# Patient Record
Sex: Male | Born: 1935 | Race: White | Hispanic: No | Smoking: Former smoker
Health system: Southern US, Community
[De-identification: ages and names within clinical notes are randomized; demographics above are authoritative.]

## PROBLEM LIST (undated history)

## (undated) DIAGNOSIS — R251 Tremor, unspecified: Secondary | ICD-10-CM

## (undated) DIAGNOSIS — E039 Hypothyroidism, unspecified: Secondary | ICD-10-CM

## (undated) DIAGNOSIS — I4891 Unspecified atrial fibrillation: Secondary | ICD-10-CM

## (undated) DIAGNOSIS — E785 Hyperlipidemia, unspecified: Secondary | ICD-10-CM

## (undated) DIAGNOSIS — C189 Malignant neoplasm of colon, unspecified: Secondary | ICD-10-CM

## (undated) DIAGNOSIS — F0390 Unspecified dementia without behavioral disturbance: Secondary | ICD-10-CM

## (undated) DIAGNOSIS — R159 Full incontinence of feces: Secondary | ICD-10-CM

## (undated) DIAGNOSIS — I672 Cerebral atherosclerosis: Secondary | ICD-10-CM

## (undated) DIAGNOSIS — G459 Transient cerebral ischemic attack, unspecified: Secondary | ICD-10-CM

## (undated) DIAGNOSIS — F329 Major depressive disorder, single episode, unspecified: Secondary | ICD-10-CM

## (undated) DIAGNOSIS — I739 Peripheral vascular disease, unspecified: Secondary | ICD-10-CM

## (undated) DIAGNOSIS — I2510 Atherosclerotic heart disease of native coronary artery without angina pectoris: Secondary | ICD-10-CM

## (undated) DIAGNOSIS — M25559 Pain in unspecified hip: Secondary | ICD-10-CM

## (undated) DIAGNOSIS — D518 Other vitamin B12 deficiency anemias: Secondary | ICD-10-CM

## (undated) DIAGNOSIS — K862 Cyst of pancreas: Secondary | ICD-10-CM

## (undated) DIAGNOSIS — M6281 Muscle weakness (generalized): Secondary | ICD-10-CM

## (undated) DIAGNOSIS — N429 Disorder of prostate, unspecified: Secondary | ICD-10-CM

## (undated) DIAGNOSIS — I509 Heart failure, unspecified: Secondary | ICD-10-CM

## (undated) DIAGNOSIS — K8020 Calculus of gallbladder without cholecystitis without obstruction: Secondary | ICD-10-CM

## (undated) DIAGNOSIS — L988 Other specified disorders of the skin and subcutaneous tissue: Secondary | ICD-10-CM

## (undated) DIAGNOSIS — E119 Type 2 diabetes mellitus without complications: Secondary | ICD-10-CM

## (undated) DIAGNOSIS — N419 Inflammatory disease of prostate, unspecified: Secondary | ICD-10-CM

## (undated) DIAGNOSIS — I219 Acute myocardial infarction, unspecified: Secondary | ICD-10-CM

## (undated) DIAGNOSIS — J449 Chronic obstructive pulmonary disease, unspecified: Secondary | ICD-10-CM

## (undated) DIAGNOSIS — F32A Depression, unspecified: Secondary | ICD-10-CM

## (undated) DIAGNOSIS — N10 Acute tubulo-interstitial nephritis: Secondary | ICD-10-CM

## (undated) DIAGNOSIS — R0989 Other specified symptoms and signs involving the circulatory and respiratory systems: Secondary | ICD-10-CM

## (undated) DIAGNOSIS — B029 Zoster without complications: Secondary | ICD-10-CM

## (undated) DIAGNOSIS — I779 Disorder of arteries and arterioles, unspecified: Secondary | ICD-10-CM

## (undated) DIAGNOSIS — I10 Essential (primary) hypertension: Secondary | ICD-10-CM

## (undated) DIAGNOSIS — R339 Retention of urine, unspecified: Secondary | ICD-10-CM

## (undated) DIAGNOSIS — F0150 Vascular dementia without behavioral disturbance: Secondary | ICD-10-CM

## (undated) DIAGNOSIS — K8689 Other specified diseases of pancreas: Secondary | ICD-10-CM

## (undated) HISTORY — DX: Chronic obstructive pulmonary disease, unspecified: J44.9

## (undated) HISTORY — DX: Vascular dementia, unspecified severity, without behavioral disturbance, psychotic disturbance, mood disturbance, and anxiety: F01.50

## (undated) HISTORY — DX: Depression, unspecified: F32.A

## (undated) HISTORY — DX: Type 2 diabetes mellitus without complications: E11.9

## (undated) HISTORY — DX: Peripheral vascular disease, unspecified: I73.9

## (undated) HISTORY — DX: Essential (primary) hypertension: I10

## (undated) HISTORY — DX: Disorder of prostate, unspecified: N42.9

## (undated) HISTORY — DX: Retention of urine, unspecified: R33.9

## (undated) HISTORY — DX: Other specified symptoms and signs involving the circulatory and respiratory systems: R09.89

## (undated) HISTORY — DX: Unspecified atrial fibrillation: I48.91

## (undated) HISTORY — DX: Muscle weakness (generalized): M62.81

## (undated) HISTORY — DX: Malignant neoplasm of colon, unspecified: C18.9

## (undated) HISTORY — DX: Acute myocardial infarction, unspecified: I21.9

## (undated) HISTORY — DX: Cyst of pancreas: K86.2

## (undated) HISTORY — DX: Other specified diseases of pancreas: K86.89

## (undated) HISTORY — DX: Zoster without complications: B02.9

## (undated) HISTORY — DX: Full incontinence of feces: R15.9

## (undated) HISTORY — DX: Hyperlipidemia, unspecified: E78.5

## (undated) HISTORY — DX: Unspecified dementia, unspecified severity, without behavioral disturbance, psychotic disturbance, mood disturbance, and anxiety: F03.90

## (undated) HISTORY — DX: Atherosclerotic heart disease of native coronary artery without angina pectoris: I25.10

## (undated) HISTORY — DX: Cerebral atherosclerosis: I67.2

## (undated) HISTORY — DX: Pain in unspecified hip: M25.559

## (undated) HISTORY — DX: Hypothyroidism, unspecified: E03.9

## (undated) HISTORY — DX: Heart failure, unspecified: I50.9

## (undated) HISTORY — DX: Other vitamin B12 deficiency anemias: D51.8

## (undated) HISTORY — DX: Other specified disorders of the skin and subcutaneous tissue: L98.8

## (undated) HISTORY — DX: Disorder of arteries and arterioles, unspecified: I77.9

## (undated) HISTORY — DX: Major depressive disorder, single episode, unspecified: F32.9

## (undated) HISTORY — DX: Acute pyelonephritis: N10

---

## 1987-07-02 HISTORY — PX: COLON SURGERY: SHX602

## 1988-07-01 HISTORY — PX: CHOLECYSTECTOMY: SHX55

## 2001-07-01 HISTORY — PX: HERNIA REPAIR: SHX51

## 2002-01-26 ENCOUNTER — Encounter: Payer: Self-pay | Admitting: Emergency Medicine

## 2002-01-26 ENCOUNTER — Emergency Department (HOSPITAL_COMMUNITY): Admission: EM | Admit: 2002-01-26 | Discharge: 2002-01-27 | Payer: Self-pay | Admitting: Emergency Medicine

## 2002-02-03 ENCOUNTER — Encounter: Payer: Self-pay | Admitting: Emergency Medicine

## 2002-02-04 ENCOUNTER — Inpatient Hospital Stay (HOSPITAL_COMMUNITY): Admission: EM | Admit: 2002-02-04 | Discharge: 2002-02-05 | Payer: Self-pay | Admitting: *Deleted

## 2002-02-17 ENCOUNTER — Encounter: Payer: Self-pay | Admitting: General Surgery

## 2002-02-17 ENCOUNTER — Inpatient Hospital Stay (HOSPITAL_COMMUNITY): Admission: EM | Admit: 2002-02-17 | Discharge: 2002-02-25 | Payer: Self-pay | Admitting: Emergency Medicine

## 2003-04-21 ENCOUNTER — Ambulatory Visit (HOSPITAL_COMMUNITY): Admission: RE | Admit: 2003-04-21 | Discharge: 2003-04-21 | Payer: Self-pay | Admitting: General Surgery

## 2003-04-21 ENCOUNTER — Encounter: Payer: Self-pay | Admitting: General Surgery

## 2003-04-25 ENCOUNTER — Inpatient Hospital Stay (HOSPITAL_COMMUNITY): Admission: RE | Admit: 2003-04-25 | Discharge: 2003-05-06 | Payer: Self-pay | Admitting: General Surgery

## 2003-04-25 ENCOUNTER — Encounter: Payer: Self-pay | Admitting: General Surgery

## 2003-04-25 ENCOUNTER — Encounter (INDEPENDENT_AMBULATORY_CARE_PROVIDER_SITE_OTHER): Payer: Self-pay

## 2004-07-01 HISTORY — PX: CORONARY ARTERY BYPASS GRAFT: SHX141

## 2004-07-01 HISTORY — PX: CATARACT EXTRACTION: SUR2

## 2004-11-30 ENCOUNTER — Inpatient Hospital Stay (HOSPITAL_COMMUNITY): Admission: EM | Admit: 2004-11-30 | Discharge: 2004-12-11 | Payer: Self-pay | Admitting: Cardiovascular Disease

## 2004-11-30 ENCOUNTER — Encounter: Payer: Self-pay | Admitting: Emergency Medicine

## 2004-12-03 HISTORY — PX: CARDIAC CATHETERIZATION: SHX172

## 2004-12-04 ENCOUNTER — Ambulatory Visit: Payer: Self-pay | Admitting: Pulmonary Disease

## 2004-12-05 ENCOUNTER — Encounter: Payer: Self-pay | Admitting: Pulmonary Disease

## 2004-12-28 ENCOUNTER — Ambulatory Visit: Payer: Self-pay | Admitting: Pulmonary Disease

## 2005-01-08 ENCOUNTER — Inpatient Hospital Stay (HOSPITAL_COMMUNITY): Admission: RE | Admit: 2005-01-08 | Discharge: 2005-01-12 | Payer: Self-pay | Admitting: Cardiothoracic Surgery

## 2005-01-27 ENCOUNTER — Emergency Department (HOSPITAL_COMMUNITY): Admission: EM | Admit: 2005-01-27 | Discharge: 2005-01-27 | Payer: Self-pay | Admitting: Emergency Medicine

## 2005-02-14 ENCOUNTER — Encounter: Admission: RE | Admit: 2005-02-14 | Discharge: 2005-02-14 | Payer: Self-pay | Admitting: Cardiothoracic Surgery

## 2006-10-21 DIAGNOSIS — R0989 Other specified symptoms and signs involving the circulatory and respiratory systems: Secondary | ICD-10-CM

## 2006-10-21 HISTORY — DX: Other specified symptoms and signs involving the circulatory and respiratory systems: R09.89

## 2008-01-27 HISTORY — PX: COLONOSCOPY: SHX174

## 2008-04-12 ENCOUNTER — Ambulatory Visit (HOSPITAL_COMMUNITY): Admission: RE | Admit: 2008-04-12 | Discharge: 2008-04-12 | Payer: Self-pay | Admitting: Gastroenterology

## 2009-03-02 HISTORY — PX: COLONOSCOPY: SHX174

## 2009-03-08 LAB — HM COLONOSCOPY

## 2009-09-10 ENCOUNTER — Inpatient Hospital Stay (HOSPITAL_COMMUNITY): Admission: EM | Admit: 2009-09-10 | Discharge: 2009-09-14 | Payer: Self-pay | Admitting: Emergency Medicine

## 2009-11-10 DIAGNOSIS — I672 Cerebral atherosclerosis: Secondary | ICD-10-CM

## 2009-11-10 HISTORY — DX: Cerebral atherosclerosis: I67.2

## 2010-04-15 ENCOUNTER — Encounter: Payer: Self-pay | Admitting: Emergency Medicine

## 2010-04-15 ENCOUNTER — Ambulatory Visit: Payer: Self-pay | Admitting: Diagnostic Radiology

## 2010-04-15 ENCOUNTER — Inpatient Hospital Stay (HOSPITAL_COMMUNITY): Admission: EM | Admit: 2010-04-15 | Discharge: 2010-04-23 | Payer: Self-pay | Admitting: Internal Medicine

## 2010-04-16 ENCOUNTER — Encounter (INDEPENDENT_AMBULATORY_CARE_PROVIDER_SITE_OTHER): Payer: Self-pay | Admitting: Internal Medicine

## 2010-04-16 HISTORY — PX: TEE WITH CARDIOVERSION: SHX5442

## 2010-04-23 ENCOUNTER — Encounter: Payer: Self-pay | Admitting: Internal Medicine

## 2010-05-12 ENCOUNTER — Emergency Department (HOSPITAL_COMMUNITY): Admission: EM | Admit: 2010-05-12 | Discharge: 2010-05-12 | Payer: Self-pay | Admitting: Emergency Medicine

## 2010-06-12 DIAGNOSIS — I482 Chronic atrial fibrillation, unspecified: Secondary | ICD-10-CM

## 2010-06-12 DIAGNOSIS — I10 Essential (primary) hypertension: Secondary | ICD-10-CM | POA: Insufficient documentation

## 2010-06-12 DIAGNOSIS — I6529 Occlusion and stenosis of unspecified carotid artery: Secondary | ICD-10-CM

## 2010-06-12 DIAGNOSIS — I5032 Chronic diastolic (congestive) heart failure: Secondary | ICD-10-CM | POA: Insufficient documentation

## 2010-06-12 DIAGNOSIS — E039 Hypothyroidism, unspecified: Secondary | ICD-10-CM

## 2010-06-12 DIAGNOSIS — C189 Malignant neoplasm of colon, unspecified: Secondary | ICD-10-CM | POA: Insufficient documentation

## 2010-06-12 DIAGNOSIS — J439 Emphysema, unspecified: Secondary | ICD-10-CM

## 2010-06-13 ENCOUNTER — Ambulatory Visit: Payer: Self-pay | Admitting: Pulmonary Disease

## 2010-06-13 DIAGNOSIS — I219 Acute myocardial infarction, unspecified: Secondary | ICD-10-CM | POA: Insufficient documentation

## 2010-07-31 NOTE — Letter (Signed)
Summary: [facility]   [place]   Imported By: Roderic Ovens 06/04/2010 09:30:47  _____________________________________________________________________  External Attachment:    Type:   Image     Comment:   External Document

## 2010-08-02 NOTE — Progress Notes (Signed)
Summary: [facility]   Imported By: Sherian Rein 06/15/2010 07:16:18  _____________________________________________________________________  External Attachment:    Type:   Image     Comment:   External Document

## 2010-08-02 NOTE — Assessment & Plan Note (Signed)
Summary: consult for copd   Primary Provider/Referring Provider:  Baltazar Najjar MD  CC:  pt last seen 11/2004. pt c/o SOB and pulm edema.  History of Present Illness: the pt is an 77y/o male who I have been asked to see for management of COPD.  He is known to me from the past, but has not been seen since 2006.  He had pfts at that time which showed moderate airflow obstruction with significant airtrapping.  He was treated with spiriva, but the pt never followed up with me.  He comes in today after a recent hospitalization for afib, decompensated diastolic heart failure, and ?copd exacerbation.  The pt has chronic doe, but is doing much better since being diuresed.  He currently is not on a maintenance bronchodilator.  He does have a dry cough with frequent clearing due to a tickle.  He is on an ACE inhibitor.  He denies chest congestion or purulence.  Current Medications (verified): 1)  Metformin Hcl 1000 Mg Tabs (Metformin Hcl) .Marland Kitchen.. 1 Two Times A Day 2)  Docusate Sodium 100 Mg Tabs (Docusate Sodium) .Marland Kitchen.. 1 Two Times A Day 3)  Vitamin B-12 1000 Mcg Tabs (Cyanocobalamin) .Marland Kitchen.. 1 Two Times A Day 4)  Mucinex 600 Mg Xr12h-Tab (Guaifenesin) .Marland Kitchen.. 1 Two Times A Day 5)  Flomax 0.4 Mg Caps (Tamsulosin Hcl) .... Once Daily 6)  Cardizem Cd 240 Mg Xr24h-Cap (Diltiazem Hcl Coated Beads) .... Once Daily 7)  Aspirin 325 Mg Tabs (Aspirin) .... Once Daily 8)  Niacin Cr 1000 Mg Cr-Tabs (Niacin) .... Once Daily 9)  Lisinopril 5 Mg Tabs (Lisinopril) .... Once Daily 10)  Aricept 10 Mg Tabs (Donepezil Hcl) .Marland Kitchen.. 1 At Bedtime 11)  Simvastatin 40 Mg Tabs (Simvastatin) .... At Bedtime 12)  Remeron 30 Mg Tabs (Mirtazapine) .... Once Daily At Bedtime 13)  Levothyroxine Sodium 25 Mcg Tabs (Levothyroxine Sodium) .... Once Daily 14)  Metoprolol Succinate 25 Mg Xr24h-Tab (Metoprolol Succinate) .Marland Kitchen.. 1 Two Times A Day 15)  Nitrostat 0.4 Mg Subl (Nitroglycerin) .... As Needed  Allergies (verified): 1)  ! * Peanuts  Past  History:  Past Medical History:  CHF (ICD-428.0)--diastolic dysfunction ATRIAL FIBRILLATION (ICD-427.31) COLON CANCER (ICD-153.9) CAROTID ARTERY DISEASE (ICD-433.10) COPD (ICD-496)-moderate 2006 by pfts.  FEV1 1.39 HYPERLIPIDEMIA (ICD-272.4) DIABETES MELLITUS, TYPE II (ICD-250.00) HYPERTENSION (ICD-401.9) HYPOTHYROIDISM (ICD-244.9)  hx of pancreatic cyst Myocardial Infarction  Past Surgical History: hernia repair Cholecystectomy heart bypass colon surgery left cataract surgery  Family History: Reviewed history and no changes required. heart disease: mother --MI 1 sister: breast cancer Alzheimers: sister and half brother  Social History: Reviewed history from 06/12/2010 and no changes required. Patient states former smoker. 1970's. 2 ppd. started age 21. retired Psychologist, sport and exercise single occas beer  Review of Systems       The patient complains of shortness of breath with activity, shortness of breath at rest, productive cough, non-productive cough, chest pain, irregular heartbeats, difficulty swallowing, and depression.  The patient denies coughing up blood, acid heartburn, indigestion, loss of appetite, weight change, abdominal pain, sore throat, tooth/dental problems, headaches, nasal congestion/difficulty breathing through nose, sneezing, itching, ear ache, anxiety, hand/feet swelling, joint stiffness or pain, rash, change in color of mucus, and fever.    Vital Signs:  Patient profile:   77 year old male Height:      69.5 inches Weight:      205 pounds BMI:     29.95 O2 Sat:      97 % on Room air  Temp:     98.0 degrees F oral Pulse rate:   104 / minute BP sitting:   150 / 78  (left arm) Cuff size:   regular  Vitals Entered By: Carver Fila (June 13, 2010 1:45 PM)  O2 Flow:  Room air CC: pt last seen 11/2004. pt c/o SOB, pulm edema Comments meds and allergies updated Phone number updated Carver Fila  June 13, 2010 1:45 PM    Physical  Exam  General:  overweight male in nad Eyes:  PERRLA and EOMI.   Nose:  mild septal deviation, but patent Mouth:  long uvula, normal palate no lesions seen Neck:  no jvd, tmg, LN Lungs:  minimal basilar crackles, no wheezing or rhonchi Heart:  irreg rhythm with cvr Abdomen:  soft and nontender, bs+ Extremities:  mild pedal edema, no cyanosis pulses intact distally, but decreased. Neurologic:  alert and oriented, moves all 4.   Impression & Recommendations:  Problem # 1:  COPD (ICD-496) the pt has a h/o moderate copd dating back to 2006.  He currently is having issues with diastolic heart failure, and it is unclear if his  obstructive disease is playing a role here.  He is clearly deconditioned as well.  Will give him a trial of bronchodilators to see if he sees a difference.  If not, would just leave him on as needed albuterol or xopenex alone.  I have also stressed to him the importance of an exercise and conditioning program.  Medications Added to Medication List This Visit: 1)  Metformin Hcl 1000 Mg Tabs (Metformin hcl) .Marland Kitchen.. 1 two times a day 2)  Docusate Sodium 100 Mg Tabs (Docusate sodium) .Marland Kitchen.. 1 two times a day 3)  Vitamin B-12 1000 Mcg Tabs (Cyanocobalamin) .Marland Kitchen.. 1 two times a day 4)  Mucinex 600 Mg Xr12h-tab (Guaifenesin) .Marland Kitchen.. 1 two times a day 5)  Flomax 0.4 Mg Caps (Tamsulosin hcl) .... Once daily 6)  Cardizem Cd 240 Mg Xr24h-cap (Diltiazem hcl coated beads) .... Once daily 7)  Aspirin 325 Mg Tabs (Aspirin) .... Once daily 8)  Niacin Cr 1000 Mg Cr-tabs (Niacin) .... Once daily 9)  Lisinopril 5 Mg Tabs (Lisinopril) .... Once daily 10)  Aricept 10 Mg Tabs (Donepezil hcl) .Marland Kitchen.. 1 at bedtime 11)  Simvastatin 40 Mg Tabs (Simvastatin) .... At bedtime 12)  Remeron 30 Mg Tabs (Mirtazapine) .... Once daily at bedtime 13)  Levothyroxine Sodium 25 Mcg Tabs (Levothyroxine sodium) .... Once daily 14)  Metoprolol Succinate 25 Mg Xr24h-tab (Metoprolol succinate) .Marland Kitchen.. 1 two times a  day 15)  Nitrostat 0.4 Mg Subl (Nitroglycerin) .... As needed  Other Orders: Consultation Level IV (44010)  Patient Instructions: 1)  will try you on spiriva one inhalation each am 2)  proair inhaler 2puffs up to every 6 hrs if needed for emergencies. 3)  if you feel spiriva is helping, please let us know and will arrange for prescription to the pharmacy.  If no difference noted, discontinue spiriva and use proair as needed. 4)  followup with me in 4mos   Immunization History:  Influenza Immunization History:    Influenza:  historical (04/17/2010)  Pneumovax Immunization History:    Pneumovax:  historical (04/17/2010)

## 2010-09-11 LAB — DIFFERENTIAL
Basophils Absolute: 0 10*3/uL (ref 0.0–0.1)
Eosinophils Absolute: 0.3 10*3/uL (ref 0.0–0.7)
Eosinophils Relative: 4 % (ref 0–5)
Lymphocytes Relative: 39 % (ref 12–46)
Lymphs Abs: 2.5 10*3/uL (ref 0.7–4.0)
Neutrophils Relative %: 46 % (ref 43–77)

## 2010-09-11 LAB — CBC
HCT: 40.3 % (ref 39.0–52.0)
Hemoglobin: 13.7 g/dL (ref 13.0–17.0)
MCH: 30.9 pg (ref 26.0–34.0)
MCHC: 34 g/dL (ref 30.0–36.0)

## 2010-09-11 LAB — LACTIC ACID, PLASMA: Lactic Acid, Venous: 1.4 mmol/L (ref 0.5–2.2)

## 2010-09-11 LAB — HEPATIC FUNCTION PANEL
Albumin: 3.1 g/dL — ABNORMAL LOW (ref 3.5–5.2)
Alkaline Phosphatase: 113 U/L (ref 39–117)
Indirect Bilirubin: 0.2 mg/dL — ABNORMAL LOW (ref 0.3–0.9)
Total Protein: 6.8 g/dL (ref 6.0–8.3)

## 2010-09-11 LAB — BASIC METABOLIC PANEL
CO2: 28 mEq/L (ref 19–32)
Glucose, Bld: 97 mg/dL (ref 70–99)
Potassium: 3.2 mEq/L — ABNORMAL LOW (ref 3.5–5.1)
Sodium: 137 mEq/L (ref 135–145)

## 2010-09-12 LAB — CBC
HCT: 39 % (ref 39.0–52.0)
HCT: 40.4 % (ref 39.0–52.0)
HCT: 40.8 % (ref 39.0–52.0)
HCT: 45.5 % (ref 39.0–52.0)
HCT: 45.5 % (ref 39.0–52.0)
HCT: 44.2 % (ref 39.0–52.0)
Hemoglobin: 13 g/dL (ref 13.0–17.0)
Hemoglobin: 13.5 g/dL (ref 13.0–17.0)
Hemoglobin: 13.9 g/dL (ref 13.0–17.0)
Hemoglobin: 15 g/dL (ref 13.0–17.0)
MCH: 30.1 pg (ref 26.0–34.0)
MCH: 30.6 pg (ref 26.0–34.0)
MCH: 30.8 pg (ref 26.0–34.0)
MCH: 31 pg (ref 26.0–34.0)
MCHC: 33.4 g/dL (ref 30.0–36.0)
MCHC: 33.6 g/dL (ref 30.0–36.0)
MCHC: 34.1 g/dL (ref 30.0–36.0)
MCV: 89.6 fL (ref 78.0–100.0)
MCV: 89.9 fL (ref 78.0–100.0)
MCV: 90.7 fL (ref 78.0–100.0)
Platelets: 285 10*3/uL (ref 150–400)
Platelets: 326 10*3/uL (ref 150–400)
Platelets: 354 10*3/uL (ref 150–400)
Platelets: 400 10*3/uL (ref 150–400)
Platelets: 269 10*3/uL (ref 150–400)
RBC: 4.3 MIL/uL (ref 4.22–5.81)
RBC: 4.41 MIL/uL (ref 4.22–5.81)
RBC: 4.87 MIL/uL (ref 4.22–5.81)
RBC: 5.06 MIL/uL (ref 4.22–5.81)
RBC: 4.63 MIL/uL (ref 4.22–5.81)
RDW: 14 % (ref 11.5–15.5)
RDW: 14.1 % (ref 11.5–15.5)
RDW: 14.4 % (ref 11.5–15.5)
RDW: 14.4 % (ref 11.5–15.5)
RDW: 14.4 % (ref 11.5–15.5)
RDW: 14.7 % (ref 11.5–15.5)
RDW: 14 % (ref 11.5–15.5)
WBC: 14.8 10*3/uL — ABNORMAL HIGH (ref 4.0–10.5)
WBC: 15.3 10*3/uL — ABNORMAL HIGH (ref 4.0–10.5)
WBC: 16.4 10*3/uL — ABNORMAL HIGH (ref 4.0–10.5)
WBC: 18.8 10*3/uL — ABNORMAL HIGH (ref 4.0–10.5)
WBC: 20.3 10*3/uL — ABNORMAL HIGH (ref 4.0–10.5)
WBC: 6.5 10*3/uL (ref 4.0–10.5)
WBC: 13 10*3/uL — ABNORMAL HIGH (ref 4.0–10.5)

## 2010-09-12 LAB — GLUCOSE, CAPILLARY
Glucose-Capillary: 187 mg/dL — ABNORMAL HIGH (ref 70–99)
Glucose-Capillary: 190 mg/dL — ABNORMAL HIGH (ref 70–99)
Glucose-Capillary: 210 mg/dL — ABNORMAL HIGH (ref 70–99)
Glucose-Capillary: 228 mg/dL — ABNORMAL HIGH (ref 70–99)
Glucose-Capillary: 228 mg/dL — ABNORMAL HIGH (ref 70–99)
Glucose-Capillary: 251 mg/dL — ABNORMAL HIGH (ref 70–99)
Glucose-Capillary: 256 mg/dL — ABNORMAL HIGH (ref 70–99)
Glucose-Capillary: 269 mg/dL — ABNORMAL HIGH (ref 70–99)
Glucose-Capillary: 295 mg/dL — ABNORMAL HIGH (ref 70–99)
Glucose-Capillary: 303 mg/dL — ABNORMAL HIGH (ref 70–99)
Glucose-Capillary: 314 mg/dL — ABNORMAL HIGH (ref 70–99)
Glucose-Capillary: 358 mg/dL — ABNORMAL HIGH (ref 70–99)
Glucose-Capillary: 369 mg/dL — ABNORMAL HIGH (ref 70–99)
Glucose-Capillary: 369 mg/dL — ABNORMAL HIGH (ref 70–99)
Glucose-Capillary: 416 mg/dL — ABNORMAL HIGH (ref 70–99)
Glucose-Capillary: 97 mg/dL (ref 70–99)

## 2010-09-12 LAB — COMPREHENSIVE METABOLIC PANEL
ALT: 53 U/L (ref 0–53)
AST: 46 U/L — ABNORMAL HIGH (ref 0–37)
Alkaline Phosphatase: 85 U/L (ref 39–117)
CO2: 26 mEq/L (ref 19–32)
Calcium: 9.3 mg/dL (ref 8.4–10.5)
Chloride: 104 mEq/L (ref 96–112)
GFR calc Af Amer: >60 mL/min (ref >60)
GFR calc non Af Amer: >60 mL/min (ref >60)
Glucose, Bld: 227 mg/dL — ABNORMAL HIGH (ref 70–99)
Potassium: 3.7 mEq/L (ref 3.5–5.1)
Sodium: 140 mEq/L (ref 135–145)

## 2010-09-12 LAB — BASIC METABOLIC PANEL
BUN: 25 mg/dL — ABNORMAL HIGH (ref 6–23)
BUN: 33 mg/dL — ABNORMAL HIGH (ref 6–23)
BUN: 34 mg/dL — ABNORMAL HIGH (ref 6–23)
BUN: 40 mg/dL — ABNORMAL HIGH (ref 6–23)
BUN: 43 mg/dL — ABNORMAL HIGH (ref 6–23)
BUN: 14 mg/dL (ref 6–23)
CO2: 26 mEq/L (ref 19–32)
CO2: 26 mEq/L (ref 19–32)
CO2: 26 mEq/L (ref 19–32)
CO2: 27 mEq/L (ref 19–32)
Calcium: 8.7 mg/dL (ref 8.4–10.5)
Calcium: 8.9 mg/dL (ref 8.4–10.5)
Calcium: 9 mg/dL (ref 8.4–10.5)
Calcium: 9.1 mg/dL (ref 8.4–10.5)
Chloride: 100 mEq/L (ref 96–112)
Chloride: 103 mEq/L (ref 96–112)
Chloride: 105 mEq/L (ref 96–112)
Chloride: 106 mEq/L (ref 96–112)
Chloride: 103 mEq/L (ref 96–112)
Creatinine, Ser: 0.97 mg/dL (ref 0.4–1.5)
Creatinine, Ser: 1.04 mg/dL (ref 0.4–1.5)
Creatinine, Ser: 1.21 mg/dL (ref 0.4–1.5)
Creatinine, Ser: 1.21 mg/dL (ref 0.4–1.5)
Creatinine, Ser: 0.9 mg/dL (ref 0.4–1.5)
GFR calc Af Amer: >60 mL/min (ref >60)
GFR calc Af Amer: >60 mL/min (ref >60)
GFR calc Af Amer: >60 mL/min (ref >60)
GFR calc non Af Amer: 58 mL/min — ABNORMAL LOW (ref >60)
GFR calc non Af Amer: >60 mL/min (ref >60)
GFR calc non Af Amer: >60 mL/min (ref >60)
GFR calc non Af Amer: >60 mL/min (ref >60)
GFR calc non Af Amer: >60 mL/min (ref >60)
Glucose, Bld: 165 mg/dL — ABNORMAL HIGH (ref 70–99)
Glucose, Bld: 231 mg/dL — ABNORMAL HIGH (ref 70–99)
Potassium: 2.8 mEq/L — ABNORMAL LOW (ref 3.5–5.1)
Potassium: 3.5 mEq/L (ref 3.5–5.1)
Potassium: 3.6 mEq/L (ref 3.5–5.1)
Potassium: 4.3 mEq/L (ref 3.5–5.1)
Potassium: 4.1 mEq/L (ref 3.5–5.1)
Sodium: 134 mEq/L — ABNORMAL LOW (ref 135–145)
Sodium: 135 mEq/L (ref 135–145)
Sodium: 137 mEq/L (ref 135–145)
Sodium: 140 mEq/L (ref 135–145)

## 2010-09-12 LAB — BRAIN NATRIURETIC PEPTIDE
Pro B Natriuretic peptide (BNP): 138 pg/mL — ABNORMAL HIGH (ref 0.0–100.0)
Pro B Natriuretic peptide (BNP): 273 pg/mL — ABNORMAL HIGH (ref 0.0–100.0)
Pro B Natriuretic peptide (BNP): 278 pg/mL — ABNORMAL HIGH (ref 0.0–100.0)

## 2010-09-12 LAB — CULTURE, RESPIRATORY W GRAM STAIN: Culture: NORMAL

## 2010-09-12 LAB — BLOOD GAS, ARTERIAL
Drawn by: 318431
Patient temperature: 98.6
TCO2: 25 mmol/L (ref 0–100)
pCO2 arterial: 46.6 mmHg — ABNORMAL HIGH (ref 35.0–45.0)
pH, Arterial: 7.324 — ABNORMAL LOW (ref 7.350–7.450)

## 2010-09-12 LAB — DIFFERENTIAL
Basophils Absolute: 0 10*3/uL (ref 0.0–0.1)
Basophils Absolute: 0 10*3/uL (ref 0.0–0.1)
Basophils Relative: 0 % (ref 0–1)
Eosinophils Absolute: 0 10*3/uL (ref 0.0–0.7)
Eosinophils Relative: 0 % (ref 0–5)
Eosinophils Relative: 0 % (ref 0–5)
Lymphocytes Relative: 7 % — ABNORMAL LOW (ref 12–46)
Lymphocytes Relative: 9 % — ABNORMAL LOW (ref 12–46)
Lymphs Abs: 1.9 10*3/uL (ref 0.7–4.0)
Lymphs Abs: 2 10*3/uL (ref 0.7–4.0)
Lymphs Abs: 2.3 10*3/uL (ref 0.7–4.0)
Monocytes Absolute: 0.6 10*3/uL (ref 0.1–1.0)
Monocytes Absolute: 2.3 10*3/uL — ABNORMAL HIGH (ref 0.1–1.0)
Monocytes Absolute: 2.5 10*3/uL — ABNORMAL HIGH (ref 0.1–1.0)
Monocytes Relative: 11 % (ref 3–12)
Monocytes Relative: 12 % (ref 3–12)
Monocytes Relative: 13 % — ABNORMAL HIGH (ref 3–12)
Monocytes Relative: 4 % (ref 3–12)
Neutro Abs: 13.5 10*3/uL — ABNORMAL HIGH (ref 1.7–7.7)
Neutro Abs: 14.2 10*3/uL — ABNORMAL HIGH (ref 1.7–7.7)
Neutro Abs: 15.6 10*3/uL — ABNORMAL HIGH (ref 1.7–7.7)
Neutro Abs: 17.9 10*3/uL — ABNORMAL HIGH (ref 1.7–7.7)
Neutrophils Relative %: 77 % (ref 43–77)
Neutrophils Relative %: 89 % — ABNORMAL HIGH (ref 43–77)

## 2010-09-12 LAB — CARDIAC PANEL(CRET KIN+CKTOT+MB+TROPI)
CK, MB: 3.2 ng/mL (ref 0.3–4.0)
Relative Index: 1.8 (ref 0.0–2.5)
Total CK: 141 U/L (ref 7–232)
Troponin I: 0.04 ng/mL (ref 0.00–0.06)

## 2010-09-12 LAB — PHOSPHORUS: Phosphorus: 3.6 mg/dL (ref 2.3–4.6)

## 2010-09-12 LAB — POCT CARDIAC MARKERS
CKMB, poc: 1.1 ng/mL (ref 1.0–8.0)
Myoglobin, poc: 94.1 ng/mL (ref 12–200)

## 2010-09-12 LAB — VITAMIN B12: Vitamin B-12: 1200 pg/mL — ABNORMAL HIGH (ref 211–911)

## 2010-09-12 LAB — AMMONIA: Ammonia: 28 umol/L (ref 11–35)

## 2010-09-12 LAB — MAGNESIUM: Magnesium: 2.6 mg/dL — ABNORMAL HIGH (ref 1.5–2.5)

## 2010-09-23 LAB — BASIC METABOLIC PANEL
BUN: 8 mg/dL (ref 6–23)
Calcium: 8.8 mg/dL (ref 8.4–10.5)
Creatinine, Ser: 0.59 mg/dL (ref 0.4–1.5)
GFR calc Af Amer: >60 mL/min (ref >60)

## 2010-09-23 LAB — GLUCOSE, CAPILLARY
Glucose-Capillary: 105 mg/dL — ABNORMAL HIGH (ref 70–99)
Glucose-Capillary: 114 mg/dL — ABNORMAL HIGH (ref 70–99)
Glucose-Capillary: 117 mg/dL — ABNORMAL HIGH (ref 70–99)
Glucose-Capillary: 129 mg/dL — ABNORMAL HIGH (ref 70–99)
Glucose-Capillary: 132 mg/dL — ABNORMAL HIGH (ref 70–99)
Glucose-Capillary: 133 mg/dL — ABNORMAL HIGH (ref 70–99)
Glucose-Capillary: 142 mg/dL — ABNORMAL HIGH (ref 70–99)
Glucose-Capillary: 149 mg/dL — ABNORMAL HIGH (ref 70–99)
Glucose-Capillary: 149 mg/dL — ABNORMAL HIGH (ref 70–99)
Glucose-Capillary: 168 mg/dL — ABNORMAL HIGH (ref 70–99)
Glucose-Capillary: 205 mg/dL — ABNORMAL HIGH (ref 70–99)

## 2010-09-23 LAB — CBC
HCT: 36.4 % — ABNORMAL LOW (ref 39.0–52.0)
Hemoglobin: 12.3 g/dL — ABNORMAL LOW (ref 13.0–17.0)
MCHC: 33.7 g/dL (ref 30.0–36.0)
MCHC: 34.4 g/dL (ref 30.0–36.0)
Platelets: 215 10*3/uL (ref 150–400)
Platelets: 222 10*3/uL (ref 150–400)
Platelets: 252 10*3/uL (ref 150–400)
RBC: 4.07 MIL/uL — ABNORMAL LOW (ref 4.22–5.81)
RDW: 14.9 % (ref 11.5–15.5)
RDW: 15.2 % (ref 11.5–15.5)
WBC: 7.9 10*3/uL (ref 4.0–10.5)

## 2010-09-23 LAB — BLOOD GAS, ARTERIAL
Bicarbonate: 27.2 mEq/L — ABNORMAL HIGH (ref 20.0–24.0)
FIO2: 0.21 %
pCO2 arterial: 47.9 mmHg — ABNORMAL HIGH (ref 35.0–45.0)
pH, Arterial: 7.373 (ref 7.350–7.450)
pO2, Arterial: 71.2 mmHg — ABNORMAL LOW (ref 80.0–100.0)

## 2010-09-23 LAB — DIFFERENTIAL
Basophils Relative: 1 % (ref 0–1)
Eosinophils Absolute: 0.1 10*3/uL (ref 0.0–0.7)
Lymphocytes Relative: 37 % (ref 12–46)
Lymphs Abs: 2 10*3/uL (ref 0.7–4.0)
Lymphs Abs: 2.6 10*3/uL (ref 0.7–4.0)
Monocytes Absolute: 0.7 10*3/uL (ref 0.1–1.0)
Monocytes Relative: 7 % (ref 3–12)
Monocytes Relative: 9 % (ref 3–12)
Neutro Abs: 3.7 10*3/uL (ref 1.7–7.7)
Neutrophils Relative %: 51 % (ref 43–77)
Neutrophils Relative %: 61 % (ref 43–77)

## 2010-09-23 LAB — COMPREHENSIVE METABOLIC PANEL
ALT: 18 U/L (ref 0–53)
Albumin: 3.2 g/dL — ABNORMAL LOW (ref 3.5–5.2)
Albumin: 3.7 g/dL (ref 3.5–5.2)
BUN: 14 mg/dL (ref 6–23)
Calcium: 8.6 mg/dL (ref 8.4–10.5)
Calcium: 8.7 mg/dL (ref 8.4–10.5)
Creatinine, Ser: 0.87 mg/dL (ref 0.4–1.5)
GFR calc Af Amer: >60 mL/min (ref >60)
Glucose, Bld: 103 mg/dL — ABNORMAL HIGH (ref 70–99)
Glucose, Bld: 140 mg/dL — ABNORMAL HIGH (ref 70–99)
Potassium: 4.4 mEq/L (ref 3.5–5.1)
Sodium: 137 mEq/L (ref 135–145)
Total Protein: 5.8 g/dL — ABNORMAL LOW (ref 6.0–8.3)
Total Protein: 6.5 g/dL (ref 6.0–8.3)

## 2010-09-23 LAB — URINALYSIS, ROUTINE W REFLEX MICROSCOPIC
Nitrite: NEGATIVE
Protein, ur: NEGATIVE mg/dL
Specific Gravity, Urine: 1.012 (ref 1.005–1.030)
Urobilinogen, UA: 1 mg/dL (ref 0.0–1.0)

## 2010-09-23 LAB — T3, FREE: T3, Free: 2.7 pg/mL (ref 2.3–4.2)

## 2010-09-23 LAB — T4, FREE: Free T4: 1.28 ng/dL (ref 0.80–1.80)

## 2010-09-23 LAB — SALICYLATE LEVEL: Salicylate Lvl: <4 mg/dL (ref 2.8–20.0)

## 2010-09-23 LAB — CK: Total CK: 106 U/L (ref 7–232)

## 2010-09-23 LAB — ETHANOL: Alcohol, Ethyl (B): <5 mg/dL (ref 0–10)

## 2010-09-23 LAB — RPR: RPR Ser Ql: NONREACTIVE

## 2010-09-23 LAB — RAPID URINE DRUG SCREEN, HOSP PERFORMED
Amphetamines: NOT DETECTED
Benzodiazepines: NOT DETECTED

## 2010-09-23 LAB — AMMONIA
Ammonia: 104 umol/L — ABNORMAL HIGH (ref 11–35)
Ammonia: 25 umol/L (ref 11–35)

## 2010-09-23 LAB — PHOSPHORUS: Phosphorus: 4.2 mg/dL (ref 2.3–4.6)

## 2010-09-23 LAB — LACTIC ACID, PLASMA: Lactic Acid, Venous: 1.4 mmol/L (ref 0.5–2.2)

## 2010-09-23 LAB — CULTURE, BLOOD (ROUTINE X 2): Culture: NO GROWTH

## 2010-09-23 LAB — FOLATE: Folate: 13.2 ng/mL

## 2010-09-23 LAB — CALCIUM, IONIZED: Calcium, Ion: 1.21 mmol/L (ref 1.12–1.32)

## 2010-11-02 ENCOUNTER — Encounter: Payer: Self-pay | Admitting: Pulmonary Disease

## 2010-11-02 ENCOUNTER — Ambulatory Visit: Payer: Self-pay | Admitting: Pulmonary Disease

## 2010-11-07 ENCOUNTER — Ambulatory Visit (INDEPENDENT_AMBULATORY_CARE_PROVIDER_SITE_OTHER): Payer: Medicare Other | Admitting: Pulmonary Disease

## 2010-11-07 ENCOUNTER — Ambulatory Visit
Admission: RE | Admit: 2010-11-07 | Discharge: 2010-11-07 | Disposition: A | Discharge status: Home / Self Care | Payer: Medicare Other | Source: Ambulatory Visit | Attending: Internal Medicine | Admitting: Internal Medicine

## 2010-11-07 ENCOUNTER — Other Ambulatory Visit: Payer: Self-pay | Admitting: Internal Medicine

## 2010-11-07 ENCOUNTER — Encounter: Payer: Self-pay | Admitting: Pulmonary Disease

## 2010-11-07 DIAGNOSIS — E119 Type 2 diabetes mellitus without complications: Secondary | ICD-10-CM

## 2010-11-07 DIAGNOSIS — M25559 Pain in unspecified hip: Secondary | ICD-10-CM

## 2010-11-07 DIAGNOSIS — J4489 Other specified chronic obstructive pulmonary disease: Secondary | ICD-10-CM

## 2010-11-07 DIAGNOSIS — J449 Chronic obstructive pulmonary disease, unspecified: Secondary | ICD-10-CM

## 2010-11-07 MED ORDER — TIOTROPIUM BROMIDE MONOHYDRATE 18 MCG IN CAPS: 18.0000 ug | ORAL_CAPSULE | Freq: Every day | RESPIRATORY_TRACT | Status: DC

## 2010-11-07 NOTE — Progress Notes (Signed)
  Subjective:    Patient ID: Nathaniel Lopez, male    DOB: 01-29-1928, 77 y.o.   MRN: [id number]  HPI The pt comes in today for f/u of his know moderate copd.  Last visit, he was started on spiriva, and his daughter states she saw a big difference in his breathing.  He has stopped using, but more so due to memory issues.  He is willing to go back on the med.  He does not want to take proair, and has an issue with actuation of the MDI.  He has not had a chest infection or acute exacerbation since the last visit.    Review of Systems  Constitutional: Negative for fever and unexpected weight change.  HENT: Positive for rhinorrhea and trouble swallowing. Negative for ear pain, nosebleeds, congestion, sore throat, sneezing, dental problem, postnasal drip and sinus pressure.   Eyes: Negative for redness and itching.  Respiratory: Positive for cough and shortness of breath. Negative for chest tightness and wheezing.   Cardiovascular: Positive for leg swelling. Negative for palpitations.  Gastrointestinal: Negative for nausea and vomiting.  Genitourinary: Positive for dysuria.  Musculoskeletal: Positive for joint swelling.  Skin: Negative for rash.  Neurological: Positive for headaches.  Hematological: Bruises/bleeds easily.  Psychiatric/Behavioral: Negative for dysphoric mood. The patient is not nervous/anxious.        Objective:   Physical Exam Ow male in nad Chest with minimally decreased bs, no wheezing Cor with rrr LE with no significant edema, no cyanosis Alert, but somewhat confused.  Moves all 4 extremities.        Assessment & Plan:

## 2010-11-07 NOTE — Patient Instructions (Signed)
Stay on spiriva one inhalation each am Can use proair for rescue if needed. Stay as active as possible followup with me in 6mos

## 2010-11-07 NOTE — Assessment & Plan Note (Signed)
The pt's daughter feels he did much better when taking spiriva on a regular basis.  However, he stopped taking on his own due to memory issues.  He is willing to go back on the med, but does not want to take proair as needed.  Will restart spiriva, and have encouraged him to stay as active as possible.

## 2010-11-16 NOTE — Consult Note (Signed)
Nathaniel Lopez, Nathaniel Lopez NO.:  [id number]   MEDICAL RECORD NO.:  [id number]          PATIENT TYPE:  INP   LOCATION:  2036                         FACILITY:  [facility]   PHYSICIAN:  Marcelyn Bruins, M.D. [facility] DATE OF BIRTH:  June 05, 1928   DATE OF CONSULTATION:  12/02/2004  DATE OF DISCHARGE:                                   CONSULTATION   REFERRING PHYSICIAN:  Nanetta Batty, M.D.   HISTORY OF PRESENT ILLNESS:  The patient is a very pleasant 77 year old  white male who I have been asked to see for wheezing.  The patient is  status post subendocardial MI complicated by rapid atrial fibrillation.  He  is scheduled for cardiac catheterization in the morning for evaluation.  The  patient has been noted to have wheezing on exam during this admission but  denies increasing shortness of breath to me. He has a history of tobacco  abuse approximately 15 years up to age 61 but none since.  The patient has  never had pulmonary function studies and he has no history of asthma during  childhood or as a young adult.  He can walk greater than 5 to 6 blocks at a  moderate pace and has no problems bringing groceries inside from the car. He  denies any chronic cough at baseline.  He currently has a mild cough but no  purulence.  He feels he is not getting a chest cold, denies any sinus  symptoms, postnasal drip or chronic throat clearing.  He does have a  significant problem with GERD, especially at night when he takes soda. The  patient is on no breathing medications as an outpatient.   PAST MEDICAL HISTORY:  1.  Colon cancer with prior resection.  2.  History of hernia repair.  3.  Status post cholecystectomy.  4.  Status post cataract extraction.  5.  History of atrial flutter in the past.   ALLERGIES:  No known drug allergies.   SOCIAL HISTORY:  He is widowed.  He is retired from [employer] representative.  He has a history of smoking 3 or 4 packs per day from age 48 to age 66 when  he worked in a tobacco factory.  He has not smoked since age 46.  Of note,  he is a Scientist, product/process development.   FAMILY HISTORY:  Remarkable for his father dying gangrene, his mother dying  of an MI at age 54.  There is also a family history of lung cancer and  Alzheimer's disease.   REVIEW OF SYSTEMS:  As per History of Present Illness.   PHYSICAL EXAMINATION:  GENERAL:  He is an obese white male in no acute  distress.  VITAL SIGNS:  Blood pressure 130/75, heart rate 70, respiratory rate 18,  afebrile.  HEENT:  Pupils equal, round and reactive to light and accommodation.  Extraocular movements are intact.  Nares are patent without discharge.  Oropharynx is clear.  NECK:  Supple without jugular venous distention of lymphadenopathies, no  palpable thyromegaly.  CHEST:  Reveals some faint basilar crackles.  No true wheezing but  prominent  upper airway pseudowheezing.  This resolved with pursed lip breathing.  There is a question of rhonchi with pursed lip breathing, however.  CARDIAC:  Exam reveals slightly irregular rhythm.  ABDOMEN:  Soft, nontender with good bowel sounds.  Genital, rectal and breast exam not done and not indicated.  EXTREMITIES:  Lower extremities are without edema.  Pulses are intact  distally.  NEUROLOGIC:  He is alert and oriented with no obvious motor deficits.   LABORATORY DATA:  Chest x-ray shows cardiomegaly but no definite edema or  infiltrates.   IMPRESSION:  Wheezing that I suspect is primarily upper airway in origin  rather than lower.  I certainly cannot exclude that the patient may have  obstructive lung disease, but there is nothing to suggest an acute  exacerbation.  Typical causes of upper airway pseudowheezing include  gastroesophageal reflux disease, postnasal drip, sinus disease, ACE  inhibitors, heart disease, as well as severe COPD with attempts at auto-  peeping.  At this point I would continue him on a bronchodilator regimen  since we really do  not know whether he has obstructive disease or not and he  has an upcoming catheterization.  We can then assess him and go from there.   PLAN:  1.  No further IV steroids needed.  2.  Would leave on a bronchodilator regimen since he has an upcoming cath,      but would recommend PFTs tests this admission to establish whether or      not he has obstructive lung disease.  3.  Twice daily proton pump inhibitor and add Reglan since this may be the      cause of his upper airway pseudowheezing.  4.  Flutter valve.      KC/MEDQ  D:  12/02/2004  T:  12/03/2004  Job:  [id number]   cc:   Nanetta Batty, M.D.  Fax: [phone]

## 2010-11-16 NOTE — Consult Note (Signed)
Nathaniel Lopez, Nathaniel Lopez                ACCOUNT NO.:  [id number]   MEDICAL RECORD NO.:  [id number]          PATIENT TYPE:  INP   LOCATION:  2036                         FACILITY:  [facility]   PHYSICIAN:  Sheliah Plane, MD    DATE OF BIRTH:  1927-12-12   DATE OF CONSULTATION:  12/04/2004  DATE OF DISCHARGE:                                   CONSULTATION   REQUESTING PHYSICIAN:  Dr. Clarene Duke.   FOLLOW-UP CARDIOLOGIST:  Dr. Clarene Duke.   PRIMARY CARE PHYSICIAN:  Dr. Viviann Spare Hinx, Marcy Panning.   SURGEON:  Drs. Weatherly, Hoxworth, and Linwood.   REASON FOR CONSULTATION:  Coronary artery disease with a new diagnosis of  diabetes mellitus.   HISTORY OF PRESENT ILLNESS:  Patient is a 77 year old male without any  previous history of cardiac disease who notes over several days previous,  prior to November 29, 2004, the patient had progressive increasing shortness of  breath and chest discomfort.  On the day of admission, he began having pain  in the mid chest, nonradiating with diaphoresis, clamminess, 10/10 pain  associated with nausea.  Went to [facility].  Was found to  be in rapid atrial fibrillation.  Was treated with Cardizem.  Total CK went  to 277 with MB of 46.6.  Troponins were 4.54, 3.56, 1.93, noted on June 2.  Since the patient has been in the hospital the past week with significant  pulmonary issues and diffuse wheezing, ultimately underwent cardiac  catheterization today.  Has new onset of angina with non-Q-wave infarct.  Echocardiogram was done in April, 2002 and repeated June 2, which shows  normal LV size, normal LV function, mild-to-moderate increasing LV  thickness.  Mild mitral regurgitation.  Mild left atrial dilatation.   CARDIAC RISK FACTORS:  Patient denies hypertension.  Denies hyperlipidemia.  Has type 2 diabetes, newly diagnosed.  His hemoglobin A1C on this admission  was 6.7.  He is a remote smoker but smoked from age 65 to 53.  None for over  30 years.  A  positive family history of myocardial infarction in his mother.  Patient denies any previous stroke.  Denies claudication.  Denies renal  insufficiency.   PAST MEDICAL HISTORY:  He has had a history of postoperative atrial  fibrillation in April, 2002, following bowel surgery.   PAST SURGICAL HISTORY:  A history of colon cancer, polypectomy in July,  2004.  Colon resection 13 years ago.  Lysis of adhesions in November, 2004.  In 1990, a cholecystectomy.  Multiple ventral hernia operations/incisional  hernia operations.  In 2003, he had an open repair of his abdominal  incisions with Marlex mesh.  He has had bilateral cataract removal.  He had  previously had a colostomy, which was subsequently taken down in 1989.   Patient is widowed and lives alone.  Is supported by his son and daughter-in-  Social worker.  The daughter-in-law is a Engineer, civil (consulting) and works for Dr. Darrick Penna.  Patient  is retired from [employer].  Denies alcohol use.  He is a  Scientist, product/process development.   At the time  of admission, his only medication was an over-the-counter colon  help.   DRUG ALLERGIES:  None known.   REVIEW OF SYSTEMS:  CARDIAC:  Include chest pain, resting shortness of  breath, exertional shortness of breath.  Denies orthopnea, presyncope,  syncope, palpitations, lower extremity edema.  GENERAL:  The patient denies  any constitutional symptoms.  RESPIRATORY:  He notes congestion and episodic  wheezing when he has a cold.  GASTROINTESTINAL:  History of colon cancer.  He has had multiple bowel surgeries, chronic constipation.  Denies any  amaurosis fugax or TIAs.  MUSCULOSKELETAL:  Has arthritis and arthritic  joints in his knees and hips.  GU:  Has nocturia and at times trouble  urinating, associated with urinary frequency.  Denies any recent infections.  Denies any easy bruisability.  Denies psychiatric history.  HEENT:  Patient  has difficulty hearing and also has a stutter.   PHYSICAL EXAMINATION:   VITAL SIGNS:  Blood pressure 132/78, pulse 84,  respiratory rate 20, O2 sat is 93% on room air.  He is 70 inches.  Weighs  207 pounds.  GENERAL:  The patient is slightly dyspneic at rest and has audible wheezing  heard just sitting by the bed with coarse rhonchi and significant cough.  HEENT:  Pupils are equal, round and reactive to light.  NECK:  Without carotid bruits.  LUNGS:  He has coarse rhonchi throughout both lung fields, heard anteriorly  and posteriorly.  HEART:  Reveals a regular rate and rhythm.  No murmur.  ABDOMEN:  Multiple well-healed abdominal scars without palpable masses.  His  abdomen is too obese to appreciate any dilatation of the abdominal aorta.  NEUROLOGIC:  Grossly intact but the patient does have some stutter.  He has  no palpable lymph nodes.  VASCULAR:  A +1 DP/PT pulses bilaterally.   LABORATORY FINDINGS:  White count 14,400, hematocrit 38.3, hemoglobin 13.3,  platelets 357, PT 12.2, INR 0.9, PTT 39.  Total cholesterol 192.  Triglycerides 155.  HDL 22.  LDL 139.  TSH is elevated.  BNP is 155.  Glucose 162.  BUN 23.  Potassium 4.  Sodium 137.   CHEST X-RAY:  Cardiomegaly.  COPD.  Prominent lung markings.   A cardiac catheterization was performed today with normal LV function.  Total occlusion of the LAD proximally with collateral filling.  A codominant  system with a large circumflex.  The right coronary artery is occluded in  the mid portion with very little filling of any distal vessel that is  bypassable in the right system.  The ostium of the diagonal has a 90%  stenosis.   IMPRESSION:  1.  Patient with acute pulmonary exacerbation with course bilateral wheezing      and productive cough with dyspnea at rest.  2.  Coronary occlusive disease with at least bypassable left anterior      descending artery and first diagonal.  The right coronary artery is      probably not bypassable. 3.  Jehovah's Witness with blood transfusion, which may make blood       transfusion and postoperative care complicated.   Anatomically, the patient would be a reasonable candidate for surgery.  Currently, with his significant pulmonary process, which is unclear how much  is acute and how much is chronic, the patient would not be considered an  operative candidate at this time.  Please reconsult [place] after his pulmonary  status has improved substantially.  This  may require him to be stabilized  to the point where he can be discharged  home and consider surgery at a later time.  In addition, he is discussing  with his family and church members the issue of cardiopulmonary bypass and  use of Cell Saver.      EG/MEDQ  D:  12/04/2004  T:  12/04/2004  Job:  [id number]

## 2010-11-16 NOTE — Consult Note (Signed)
NAME:  LOU, LOEWE NO.:  [id number]   MEDICAL RECORD NO.:  [id number]          PATIENT TYPE:  INP   LOCATION:                               FACILITY:  [facility]   PHYSICIAN:  Sheliah Plane, MD    DATE OF BIRTH:  1928-01-31   DATE OF CONSULTATION:  01/03/2005  DATE OF DISCHARGE:                                   CONSULTATION   Patient was seen in the office.   REQUESTING PHYSICIAN:  Dr. Clarene Duke.   PRIMARY CARE PHYSICIAN:  Dr. Viviann Spare Hinx, Marcy Panning.   PREVIOUS GENERAL SURGEONS:  Drs. Weatherly, [place], and [place].   REASON FOR CONSULTATION:  Coronary occlusive disease with new diagnosis of  diabetes.   HISTORY OF PRESENT ILLNESS:  Patient is a 77 year old male who presented to  [facility] in early June with progressive shortness of breath and  chest discomfort.  At the time of admission, he had 10/10 pain in his chest  with nausea, clamminess, diaphoresis.  He was felt to be in rapid atrial  fibrillation.  Total CK was 277 with an MB of 46.  The patient subsequently  had a prolonged hospitalization with diffuse wheezing, respiratory problems.  He also was noted to have elevated glucoses and hemoglobin A1C slightly  elevated at 6.7.  He is a former smoker, between the ages of 41 and 39, but  none for the past 30 years.  He denied hyperlipidemia or hypertension.  He  denied claudication.  Denied renal insufficiency.  The patient had a history  of postoperative atrial fibrillation in April, 2002 following bowel  resection.   During this admission, cardiac catheterization was performed.  He was  consented for bypass surgery but because of diffuse pulmonary complications,  probably related to an acute pneumonia at the time of his admission and also  because he was a Jehovah's Witness, surgery was delayed until his  respiratory status was improved.  He now returns to the office for  reconsideration of surgery.   PAST MEDICAL HISTORY:  1.  As  noted above.  2.  Episodic history of postoperative atrial fibrillation in April, 2002.  3.  History of colon cancer with polypectomy in July, 2004.  Colon resection      13 years ago.  4.  Lysis of adhesions in November, 2004.  5.  In 1990, he had a cholecystectomy.  6.  Multiple ventral hernia repairs in the past.  7.  In 2003, he had open repair of his abdominal incision with Marlex mesh.  8.  He has had bilateral cataracts.  9.  He had a previous colostomy which was taken down in 1989.   SOCIAL HISTORY:  Patient is widowed and lives alone.  He is supported by his  son and daughter-in-law.  His daughter-in-law is a Engineer, civil (consulting) who works for Dr.  Darrick Penna.  Patient is retired from [employer].  He denies  alcohol use.  He is a Scientist, product/process development.   CURRENT MEDICATIONS:  1.  Lanoxin 0.25 daily.  2.  Zocor 40 mg a day.  3.  Aspirin 325 mg daily.  This has been held starting today.  4.  Lasix 20 mg a day.  5.  Potassium 20 mEq.  6.  Protonix 40 mg a day.  7.  Reglan 10 mg q.i.d.  8.  Glucotrol 2.5 mg daily.  9.  Xanax 0.125 p.r.n.  10. Lopressor 25 1/2 tablet once daily.  11. Xopenex, albuterol, and Pulmicort nebulizers.   DRUG ALLERGIES:  None known.   REVIEW OF SYSTEMS:  CARDIAC:  Chest pain, shortness of breath, exertional  shortness of breath.  Denies orthopnea, presyncope, syncope, palpitations,  lower extremity edema.  Denies constitutional symptoms other than fatigue.  GASTROINTESTINAL:  He has had multiple bowel surgeries in the past.  Has  complaints of chronic constipation.  He denies amaurosis or TIAs.  He has  arthritic changes in his joints, knees, and hips.  He has nocturia with  trouble urinating.  He denies any recent infections.   PHYSICAL EXAMINATION:  VITAL SIGNS:  Blood pressure 120/60, pulse 60 and  regular, respiratory rate 20, O2 sat is 97%.  The patient is 70 inches tall.  Weighs 207 pounds.  RESPIRATORY:  Since he was seen several weeks  ago, his overall is much  improved.  He is not dyspneic at rest.  There is no audible wheezing.  He  has no jugular venous distention.  Breath sounds are distant bilateral but  without active wheezing.  CARDIAC:  Regular rate and rhythm without murmur or gallop.  He has no  carotid bruits.  ABDOMEN:  Grossly intact with multiple healed abdominal scars.  EXTREMITIES:  He has 1+ DPPT pulses bilaterally.   LABORATORY FINDINGS:  On January 03, 2005, his hematocrit is 43.8 with a  hemoglobin of 14.2, creatinine 0.8, potassium 4.7.   Cardiac catheterization films are reviewed.  The left main is normal.  Circumflex is a large codominant artery with a large obtuse marginal that  covers the entire wall.  Some views appear to be approximately 40-50%.  Others appear there is no lesion in the obtuse marginal.  There is a  diagonal vessel that is 90% proximally.  The LAD is totally occluded with  retrograde filling.  The right coronary artery is occluded in the mid  portion.  There is very little distal right artery visualized.   Repeat hand-held PFTs done in the office reveal an FEV1 of 1.7, FVC of 2.47.   After reviewing in detail the patient's history and findings, he seems much  improved from when he was seen several weeks ago with his hemoglobin stable.  Patient is a TEFL teacher Witness, and this is again confirmed in discussion  with him and his son that he does not wish any blood products.  He is  willing to accept the Cell Saver.  Because of his coronary lesions,  angioplasty will be of limited use, since it would not be able to be used on  the right and on the LAD.  I have recommended to him high-risk coronary  artery bypass grafting.  Because of his age, pulmonary status,  and the fact that he is a Scientist, product/process development, he is much improved, and he is  willing to proceed.  The risks of surgery, including death, infection, stroke, myocardial infarction, bleeding, blood transfusion, were all   discussed with the patient in detail.       EG/MEDQ  D:  01/03/2005  T:  01/03/2005  Job:  [id number]   cc:   Thereasa Solo.  Little, M.D.  [address]  [address]  [place]  [place] [address]  Fax: [phone]

## 2010-11-16 NOTE — H&P (Signed)
NAMEIGNATZ, DEIS                            ACCOUNT NO.:  [id number]   MEDICAL RECORD NO.:  [id number]                   PATIENT TYPE:  EMS   LOCATION:  ED                                   FACILITY:  [facility]   PHYSICIAN:  Anselm Pancoast. Zachery Dakins, M.D.          DATE OF BIRTH:  [birth year]   DATE OF ADMISSION:  02/17/2002  DATE OF DISCHARGE:                                HISTORY & PHYSICAL   CHIEF COMPLAINT:  Drainage from abdominal incision, nausea and vomiting.   HISTORY OF PRESENT ILLNESS:  The patient is a 77 year old Caucasian male who  I first met in the office approximately three weeks ago after the patient  was seen by Dr. Marcy Panning here in the emergency department at [facility]  He was thought to have a chronic seroma following a ventral hernia repair  that had been done in [place] approximately three months earlier.  The  patient states that his abdominal surgery had a colon cancer removed, and  this was about 10 or 15 years ago.  Then he had a cholecystectomy through a  right subcostal incision, and then about a year ago, had a repair of a  ventral hernia.  The hernia was repaired with mesh, the mesh got infected,  and he required a repeat operation.  This was in 5/03, at which time the  mesh was removed, a White and a drain were placed.  Postoperatively, the  patient appeared to be doing satisfactorily, but continued to have  reoccurring serum accumulations, and he became disenchanted.  His daughter  is Dr. Deterding's nurse, and the suggestion was made that I see him in the  office.  However, before I could see him, the patient had problems with  nausea and vomiting and was seen here in the emergency room by Dr. Orson Slick  who was on call, who evaluated him and a barium enema and Gastrografin was  obtained.  This was unremarkable, and a JP drain was placed in this fluid  collection anterior to the fascia to the right of the umbilicus.  I saw the  patient a few days  later in the office, at which time the drainage was  probably about 100 cc over a 24 hour period, and he has been seen several  times since then, but more recently had increased drainage, and then over  the past several days has had nausea and vomiting.  I saw him today on a  routine appointment, and on exam I was impressed, it appeared that he had a  recurrent hernia, whether the drain was in the peritoneal cavity or  subcutaneous tissue I really could not tell.  The fluid when it was  originally cultured about two weeks ago, grew no organisms, and the Foley  that is draining looked serous sort of like ascites.  I recommended that we  go on over to [facility] and obtain  a CT and laboratory studies.  The CBC and CMET are all normal, but the CAT scan does confirm that he does  have a recurrent incisional hernia.  The fascia area separation there above  the umbilicus with fairly large multiple loops of small bowel above the  fascia, and it appears that the drain is actually within the peritoneal  lining.  He has chronically dilated small bowel, some of the contrast is  going through, and this is not a complete small bowel obstruction.  The  patient states that he has not vomited over the past 24 hours.  Obviously  with these findings, the patient does need to be admitted.  We will place a  NG tube to keep compressing the small intestine, and add him to the OR  schedule for tomorrow.  The patient's last colonoscopy was about two to 2-  1/2 years ago, and of course he had the Gastografin barium enema when Dr.  Orson Slick originally saw him which was about four weeks earlier.  He is not on  any type of chronic medications.  Denies high blood pressure or angina.  He  is retired, and I am not sure what his former occupation was.   REVIEW OF SYMPTOMS:  The patient denies CNS symptoms of seizures or bad  headaches or problems.  GASTROINTESTINAL:  He says his last bowel movement  was about two  days ago.  His stools are normal in color.  He has had a  little problem with constipation previously.  GENITOURINARY:  He is able to  void without problems.  MUSCULOSKELETAL:  Denies acute problems.  CARDIAC:  The patient denies ever having substernal chest pain or anything consistent  with angina, and he has not had any problems with high blood pressure.   IMPRESSION:  1. Recurrent incisional hernia with high grade partial small bowel     obstruction.  2. Recent history of mesh removed because of infection following a ventral     herniorrhaphy with mesh approximately one year ago.   PLAN:  The patient will be admitted and placed on IV Kefzol.  We will place  a NG tube.  He will get a soak suds enema in the morning, and we will add  him to the OR schedule for a laparotomy, repair of small bowel obstruction  or release the small bowel obstruction, and then repair of the incisional  hernia tomorrow.  I discussed with the patient and his son-in-law that the  options of whether to place new mesh or to try to repair this without mesh  is sort of a toss up, in that the mesh would decrease the incidence of  hernia repair breaking down, but also placement of mesh in this setting  since he has had drains and a recent infection just several months ago has  an increased incidence of repeat herniorrhaphy.  It appears from his CT that  he has some mesh presently within the abdominal cavity, but the cultures  recently from the fluid had not shown organisms.  He certainly does not have  an acute abdomen at this time, and I think we can safely suck on him for 24  hours to decompress the skin and small bowel to help to give [place] more room so  that we are more likely able to close this without hopefully in need of  mesh.  Anselm Pancoast. Zachery Dakins, M.D.    WJW/MEDQ  D:  02/17/2002  T:  02/17/2002  Job:  [id number]

## 2010-11-16 NOTE — Discharge Summary (Signed)
Nathaniel Lopez, Nathaniel Lopez                            ACCOUNT NO.:  [id number]   MEDICAL RECORD NO.:  [id number]                   PATIENT TYPE:  INP   LOCATION:  0449                                 FACILITY:  [facility]   PHYSICIAN:  Anselm Pancoast. Zachery Dakins, M.D.          DATE OF BIRTH:  July 24, 1927   DATE OF ADMISSION:  02/17/2002  DATE OF DISCHARGE:  02/25/2002                                 DISCHARGE SUMMARY   DISCHARGE DIAGNOSIS:  Tonsillar obstruction secondary to intra-abdominal  effusions and breakdown of recent ventral incisional hernia repair.   OPERATION:  Exploratory laparotomy, lysis of adhesions, closure of midline  incision under general anesthesia.   HISTORY OF PRESENT ILLNESS:  The patient is a 77 year old Caucasian male who  was managed in [place] for basically at first he had a colon resection  for carcinoma of, I think, left colon about 10 years ago, and then he had a  cholecystectomy years ago through a subcostal incision, and approximately a  year ago developed an incisional hernia that was repaired with mesh in  [place].  Postoperatively, the patient stated he had discomfort,  serous drainage, and etc., and approximately nine months later developed a  blister, and was taken back to surgery, and according to the patient and his  son-in-law, the mesh was removed.  Afterwards, however, he continued to have  a large amount of serous drainage, had a Jackson-Pratt drain that was  removed, one was reinserted, he appeared to be making no progress.  His  daughter works as Dr. Deterding's nurse, and recommended that he see me.  Prior to this time, however, the patient had developed obviously a fluid  accumulation, and he was seen in the emergency room here by Dr. Marcy Panning,  and x-rays questioned whether a bowel obstruction was developed.  A  Gastrografin enema was performed which did not show obstruction, and Dr.  Orson Slick placed a Jackson-Pratt drain on top of the mesh  thinking that this  was a large seroma, and asked that I follow him, simply had wanted to see  me.  I saw him one time in the office at which time the serum was being  adequately drained, cultured this, and it did not grow any organisms, and  recommended that we continue with the suction.  Then I was on vacation, and  he was seen on two occasions in the office by the partners.  He returned for  a scheduled appointment and told me he had been nauseous a couple of days  earlier.  Laboratory studies looked unremarkable, but I thought I could feel  a definite bulge on the lateral left aspect of the old hernia repair, and I  sent him on over to [facility] and obtained a CT that showed a small bowel  obstruction with a knuckle of bowel up above the mesh.   HOSPITAL COURSE:  We hospitalized him, put him on  NG suction to decompress  him, and then scheduled him for a laparotomy the following day.  He was  taken to surgery.  Dr. Janee Morn assisted, and he did have a breakdown of the  lateral left edge of this dual mesh that had been placed.  The mesh itself  was not intraperitoneal, but kind of extraperitoneal, held in place with  sutures and all the little staples, and I went ahead and removed the mesh  which was not adherent at all.  I saw one other fascial defect and connected  the two, and through this small opening I basically did a full small bowel  exploration.  He had an area down in the pelvis that I am sure was from his  previous colon surgery that appeared to be a point of chronic obstruction,  in that it was definitely very dilated to that point, and then decompressed  for the last 2 to 3 feet of the small bowel.  I have never been able to  obtain the operative notes from his previous surgeries on whether or not any  type of laparotomy was performed when they did his hernia repairs, and I  elected to not place any mesh since he had a previous definite infection,  and then was having all  this serous drainage.  To a closed area, and thought  it would it be best to get him closed, get rid of the bowel obstruction, and  if he does develop a repeat incisional hernia we will place a piece of  Prolene mesh intraperitoneally at a later time if needed.  It appeared that  the closure came together with fairly good fascia muscle strength, and I  placed a Jackson-Pratt drain in the subcutaneous pocket which was a very  large pocket, and the cultures at the time of surgeries have not shown  organisms.  I had him on Kefzol IV for about five days, as he had the NG  tube, etc.  He started off by having bowel movements, he was tolerating a  full liquid diet without problems.  His wounds appear to be healing  satisfactorily.  The serous drainage is scant and clear, and I think he can  be discharged with the instructions of basically a full liquid diet advanced  to a select diet over the next couple of days, and to wear the abdominal  binder at all times.  I will see him back in the office early next week to  remove the drain and skin staples, and I am going to discharge him on Keflex  500 mg b.i.d. because of this drainage.  I instructed him to keep out of the  shower until after the drains are removed.   CONDITION ON DISCHARGE:  Improved.   Hopefully, this is not going to be a reoccurring problem.  He has Vicodin at  home which he can take if needed, but he is having very little pain at this  time.                                                Anselm Pancoast. Zachery Dakins, M.D.    WJW/MEDQ  D:  02/25/2002  T:  02/25/2002  Job:  [id number]   cc:   Fayrene Fearing L. Deterding, M.D.  [address] [place]  [place] [address]  Fax: [phone]

## 2010-11-16 NOTE — H&P (Signed)
Nathaniel Lopez, Nathaniel Lopez                ACCOUNT NO.:  [id number]   MEDICAL RECORD NO.:  [id number]          PATIENT TYPE:  INP   LOCATION:  0106                         FACILITY:  [facility]   PHYSICIAN:  Jonna L. Robb Matar, M.D.DATE OF BIRTH:  1928/04/19   DATE OF ADMISSION:  11/30/2004  DATE OF DISCHARGE:                                HISTORY & PHYSICAL   CHIEF COMPLAINT:  My chest hurts.   HISTORY:  This is a 77 year old Caucasian male who has an underlying history  of getting congestion sometimes when he lies down, but is having congestion  over the past couple of days which his family initially thought was due him  being in a very hot, un-air-conditioned area. He came over to his son and  daughter-in-law's house a day-and-a-half ago and at that time she checked  his pulse and noticed that it was perhaps a little irregular but not really  fast. Yesterday, he was complaining more of congestion and they thought he  had come down with a summer cold. Today, at about 10:30 a.m. he started to  get pain in the middle of his chest, nonradiating, became diaphoretic and  clammy. The pain was a 10/10, even slight nausea. He came to the emergency  room at about 1 p.m. and was found to be in rapid atrial fibrillation. He  got a bolus of Cardizem 20 mg which dropped his blood pressure down to about  95, and then was put on a Cardizem drip. He has no past history of MI, heart  failure, heart murmurs; no edema.   PAST MEDICAL HISTORY:  1.  Cancer of the colon.  2.  History of congestion.  3.  He is color blind.  4.  He has decreased hearing and a stutter.  5.  He has had several abdominal surgeries and has problems with chronic      constipation. He has been put on all sorts of medications including      MiraLax and Colace and apparently there is a Colon Health herbal product      that seems to work the best for him.  His last bowel movement was 2 days      ago.   OPERATIONS:  1.  Colon surgery.  2.  Gallbladder.  3.  Hernia operations x3.  4.  Bilateral cataracts.   FAMILY HISTORY:  Coronary artery disease. His mother had an MI. Father died  of infection from a ruptured appendix. One brother died of lung cancer. One  sister has Alzheimer's. He has one son who is healthy.   SOCIAL HISTORY:  Ex-smoker for many years, nondrinker, occasional social  alcohol.   DRUG ALLERGIES:  None.   MEDICATIONS:  None.   REVIEW OF SYSTEMS:  He has gained some weight. No fever, no visual problems.  His daughter-in-law noticed that he was wheezing and he wheezes when he gets  excited. No peripheral edema. He had some nausea today. No diarrhea, no  hematemesis or melena, no kidney problems. No history of diabetes or thyroid  disease. No unusual bleeding or bruising. He has  some mild arthritis. No  history of stroke. Other review of systems were negative.   PHYSICAL EXAMINATION:  VITAL SIGNS:  Temperature 97.1, pulse 159,  respirations 20, blood pressure was 139/76, 95% O2. At present his rate is  down to 127, blood pressure is 115/78.  GENERAL:  He is a well-developed older white male with a stutter and  decreased hearing.  HEENT:  Conjunctivae and lids are normal. Pupils are reactive, extraocular  movements are full.  NECK:  He has no thyromegaly or carotid bruits.  LUNGS:  Respiratory effort slightly increased. The lungs have a diffuse  crackle but no overt wheeze, no dullness.  HEART:  Is irregularly irregular, tachycardic, normal S1 and S2 without any  murmurs.  EXTREMITIES:  Pulses show no bruits. There is no cyanosis, clubbing, or  edema.  ABDOMEN:  Obese, nontender, slightly distended, several surgical scars.  EXTERNAL GENITALIA:  Normal.  LYMPH:  There is no adenopathy.  MUSCULOSKELETAL:  Muscle strength is 5/5 with full range of motion in all  four extremities. He has some osteoarthritic changes.  SKIN:  Shows no rashes, lesions, nodules, induration.  NEUROLOGIC:  Cranial nerves  are intact. DTRs are 2+. Sensation is intact. He  is alert and oriented. Normal memory, judgment, and affect. Good sense of  humor.   INITIAL LABORATORY DATA:  Shows EKG has old inferior wall MI, rapid atrial  fibrillation, perhaps some slight T inversion in I and aVL, but the bulk of  the precordium has normal T-waves not suggestive of ischemia. CBC is  unremarkable. Chemistry showed potassium of 3.2. Cardiac enzymes are  negative but his myoglobin is high. BNP is 108. Chest x-ray shows  cardiomegaly, congestive heart failure, pulmonary hypertension.   IMPRESSION:  1.  Atrial fibrillation with rapid ventricular response. He has received      digoxin 0.75, so far looks like it is slowing his heart rate down. He      may require amiodarone. It is not clear how long he has had atrial      fibrillation so cardioversion could be problematical. The patient      longterm will need anticoagulation with Coumadin if he does not covert      or if he has recurrent atrial fibrillation. I have consulted      [facility].  2.  Hypokalemia. This will be replaced.  3.  Congestive heart failure. Once his blood pressures are stabilized, he      should be placed on ACE inhibitor.  4.  Chronic constipation.  5.  Decreased hearing.      JLB/MEDQ  D:  11/30/2004  T:  11/30/2004  Job:  [id number]

## 2010-11-16 NOTE — Op Note (Signed)
Nathaniel Lopez                            ACCOUNT NO.:  [id number]   MEDICAL RECORD NO.:  [id number]                   PATIENT TYPE:  INP   LOCATION:  X007                                 FACILITY:  [facility]   PHYSICIAN:  Anselm Pancoast. Zachery Dakins, M.D.          DATE OF BIRTH:  20-Jul-1927   DATE OF PROCEDURE:  04/25/2003  DATE OF DISCHARGE:                                 OPERATIVE REPORT   PREOPERATIVE DIAGNOSIS:  Multiple incisional hernias with partial small  bowel obstruction.   POSTOPERATIVE DIAGNOSIS:  Multiple incisional hernias with partial small  bowel obstruction.   OPERATION:  Exploratory laparotomy and lysis of adhesions and repair of  multiple incisional hernias.   General anesthesia.   SURGEON:  Anselm Pancoast. Zachery Dakins, M.D.   HISTORY:  Nathaniel Lopez is a 77 year old Caucasian male whom I first met  approximately a year ago when he had a small bowel obstruction secondary to  a dehiscence of a recent incisional hernia repair.  He has had multiple  previous abdominal surgeries, a cholecystectomy, subcostal, a left  paramedian with a colostomy and then a colostomy takedown, then a laparotomy  for repair of incisional hernias, then a laparoscopic attempt with repair of  hernias.  This broke down and he got an intestinal obstruction through a  loop of bowel, and I first met him when he eviscerated up in the abdominal  wall and we removed a piece of infected mesh and tried to close the midline  fascia with sutures and he was treated with an NG tube, etc., and I followed  him the office and he is obviously getting recurrent incisional hernias, but  I tried an abdominal binder and really with no improvement.  He started  having cramping and bloating, and a recent CT showed basically a partial  small bowel obstruction, and it looks like he has an incisional hernia to  the left of the midline.  He has a colon that is up in the abdominal wall  where he has had his old colostomy,  and then he has a hernia coming out  through the umbilical area.  I put him a liquid diet and kind of a gentle  partial bowel prep in preparation for surgery and scheduled him __________  urgently today.  I am planning on opening him up through the midline  incision and then see if we can repair these.   The patient was given 3 g of Unasyn and his PAS stockings and positioned him  on the table, a Foley catheter administered after he was anesthetic, and  then the abdomen was prepped with Betadine scrub and solution and draped in  a sterile manner.  I made a small incision in the area where you could feel  a midline weakness and then carefully opened into the peritoneal cavity and  then extended the incision superiorly and inferiorly and then could get my  fingers  into the peritoneal cavity in this kind of fascial defect to the  left, a loop of bowel coming out through this, then there is a bigger one  down at the umbilicus to the right and below the umbilicus and then a  definite weakness up where the colon is up above the abdominal wall in the  right upper quadrant below his Kocher gallbladder incision.  After all of  these were basically freed up and taken down, I also noted that he has a  hernia up into the top of the midline incision from just where the Kocher  and the midline incision kind of come together.  I basically freed up all  the intestinal loops that were chronically adherent to these areas and then  freed the fascia where the colon, where he had had the colostomy, had  herniated up into the abdominal wall, and freed this up circumferentially  and then closed the fascia with a running 2-0 Vicryl.  Next the area down  below the umbilicus, this was all freed and as far as on what type of mesh,  he has had two previous attempts with mesh and keep getting infected.  I  think I will use a piece of Prolene mesh and place a piece within the  peritoneal cavity in the top part where  the omentum is under it and then  close the fascia around the umbilicus and put a smaller piece of mesh at the  external portion of the umbilicus to kind of reinforce this.  The first  piece of mesh was probably about close to 6 x 6 inches, and it goes up under  where the colon went through the anterior fascia and the hernia in the top  part of the midline, and I used some of the stitches were placed with an  Endoclose like you do for the laparoscopic, and then others were placed  under direct vision through the abdominal wall fascia where he had these big  loops of small bowel up in the subcutaneous tissue.  With this mesh, now  there was about an inch and a half area below that I closed this with  interrupted 0 Prolenes and then actually placed the mesh so it was anchored  down with the stitches to kind of reinforce the area.  I lysed all adhesion,  tried to put the small bowel back in its normal anatomic position, and we  will use an NG tube postoperatively, as I am sure he will have a significant  ileus.  We will keep our fingers crossed as far as, hopefully he will not  reject or spit any of these pieces of mesh as we did not open up the  intestine at either place.  I think the previous times where he has had  infections there would be enterotomies made.  The subcutaneous wound was  closed with a running 2-0 Vicryl.  I did have a 10 Blake drain in the lower  portion and then the skin closed with staples.  The operating time was a  little over four hours, and the patient was hemodynamically stable, had only  a small amount of urine, and he had about 400 mL of crystalloid solution.  We will use PCA morphine for incisional pain and hopefully his bowel  function will start in approximately two to three days.  Anselm Pancoast. Zachery Dakins, M.D.    WJW/MEDQ  D:  04/25/2003  T:  04/25/2003  Job:  [id number]

## 2010-11-16 NOTE — Discharge Summary (Signed)
NAMEDEVLYN, RETTER                ACCOUNT NO.:  [id number]   MEDICAL RECORD NO.:  [id number]          PATIENT TYPE:  INP   LOCATION:  2036                         FACILITY:  [facility]   PHYSICIAN:  Jonna L. Robb Matar, M.D.DATE OF BIRTH:  [birth year]   DATE OF ADMISSION:  11/30/2004  DATE OF DISCHARGE:  12/11/2004                                 DISCHARGE SUMMARY   PRIMARY CARE PHYSICIAN:  Unassigned.   CONSULTATIONS:  [facility], cardiovascular and  thoracic surgery, pulmonary.   PROCEDURE:  Cardiac catheterization on June 5.   ALLERGIES:  None.   SOCIAL HISTORY:  Jehovah's witness.   CODE STATUS:  Full.   FINAL DIAGNOSES:  1.  Non-ST elevation myocardial infarction.  2.  Congestive heart failure.  3.  Atrial fibrillation with rapid ventricular response.  4.  Chronic obstructive pulmonary disease.  5.  Type 2 diabetes.  6.  Hypercholesterolemia.  7.  Decreased hearing.   HISTORY:  The patient the day of admission developed chest pain accompanied  by diaphoresis and clamminess; was found to be in rapid atrial fibrillation  treated with Cardizem.  He had no previous history of myocardial infarction  or heart failure, but he has had chronic wheezing and emphysema.   Physical examination on admission was noted for pulse of 159, blood pressure  139/76.  He had a stutter, decreased hearing, diffuse crackles throughout  the lungs, irregularly irregular tachycardic heart rate.  Abdomen had  several surgical scars.  Osteoarthritis.  Initial EKG showed rapid atrial  fibrillation, old inferior wall myocardial infarction, potassium 3.2, BNP  108.  Chest x-ray showed cardiomegaly, congestive heart failure, and  pulmonary hypertension.   The patient was seen in consultation by [facility] and  converted to regular sinus rhythm and had elevated enzymes consistent with a  non-ST-elevation myocardial infarction.  Cardiac catheterization  showed a  completely occluded LAD and  RCA and a 90% diagonal, moderate size vessel  with 90% proximal narrowing, normal left ventricular systolic function.  LAD  filled retrograde, RCA had left-to-right collaterals.   He was seen in consultation by Dr. Shelle Iron for pulmonary who felt that his  wheezing was more upper airway origin and suggested on continuing him on  bronchodilators.  He was seen in consultation by Dr. Tyrone Sage.  After some  time, with some modest improvement in his pulmonary function, we are still  reluctant to operate on him with his present pulmonary condition.  Other  issues found were hyperlipidemia treated with statin, atrial fibrillation  which was initially treated with digoxin and Cardizem and then continued on  the mild congestive heart failure treated with Lasix, and hyperglycemia  treated with Glucotrol.   DISPOSITION:  The patient will be discharged to home with followup with Dr.  Clarene Duke and Dr. Shelle Iron in 10 days, and Dr. Tyrone Sage in two weeks.  At that  time, hopefully it will become more clear whether the patient will be a  surgical candidate due to issues regarding inability to transfuse him,  overall consensus was somewhat reluctant to put him on  Coumadin.  He will be  going home on aspirin and rate controlling agents for his atrial  fibrillation.  He is to stay on a low salt diet.  His medications will be as  follows:  1.  Digoxin 0.25 mg daily.  2.  Potassium 20 t.i.d.  3.  Toprol 25 mg half tablet daily.  4.  Zocor 40 daily.  5.  Aspirin 81 daily.  6.  Lasix 20 daily.  7.  Diltiazem 240 daily.  8.  Protonix 40 biweekly.  9.  Reglan 10 mg a.c. and h.s.  10. Glucotrol XL 2.5 mg daily.  11. Xanax 0.125 mg b.i.d. p.r.n.   He is to be on home oxygen two liters nasal cannula.  He is to be on Xopenex  0.63 mg, and Atrovent 0.5 mg b.i.d.       JLB/MEDQ  D:  12/11/2004  T:  12/11/2004  Job:  [id number]

## 2010-11-16 NOTE — Discharge Summary (Signed)
NAMEODDIE, BOTTGER                ACCOUNT NO.:  [id number]   MEDICAL RECORD NO.:  [id number]          PATIENT TYPE:  INP   LOCATION:  2029                         FACILITY:  [facility]   PHYSICIAN:  Sheliah Plane, MD    DATE OF BIRTH:  [birth year]   DATE OF ADMISSION:  01/08/2005  DATE OF DISCHARGE:  01/13/2005                                 DISCHARGE SUMMARY   ADMISSION DIAGNOSIS:  Coronary artery disease.   DISCHARGE/SECONDARY DIAGNOSES:  1.  History of coronary artery disease, status post coronary artery bypass      graft.  2.  Preoperative atrial fibrillation, now in sinus rhythm.  3.  History of colon cancer with polypectomy in July, 2004.  Colon resection      13 years ago.  4.  History of lysis of adhesions in November, 2004.  5.  History of cholecystectomy in 1990.  6.  Multiple ventral hernia repairs.  7.  Open repair of his abdominal incision with Marlex mesh in 2003.  8.  Bilateral cataract extraction.  9.  Previous colostomy, which was taken down in 1989.  10. Diabetes mellitus type 2 diagnosed in June, 2006.  11. Chronic obstructive pulmonary disease.  12. History of congestive heart failure.  13. History of non-ST elevation myocardial infarction in June, 2006.  14. Hypercholesterolemia.  15. He is a Scientist, product/process development.  16. No known drug allergies.   PROCEDURES:  1.  On January 08, 2005, off-pump coronary artery bypass grafting x3 with the      left internal mammary artery to the left anterior descending, reverse      saphenous vein graft to the intermediate coronary artery, reverse      saphenous vein graft to the posterior descending coronary artery with      endoscopic vein harvesting attempted in the right thigh, completed at      the left thigh, and open-vein harvest to the right lower leg.  Surgeon      was Dr. Sheliah Plane.   BRIEF HISTORY:  Mr. Stanforth is a 77 year old Caucasian male who presented to  [facility] in early June with progressive  shortness of breath and  chest discomfort.  At the time of admission, he reported 10/10 pain in his  chest associated with nausea, clamminess and diaphoresis.  He was felt to be  in rapid atrial fibrillation.  Total CK was 277 with an MB of 46.  The  patient subsequently had a prolonged hospitalization with diffuse wheezing  and respiratory problems and was seen by Dr. Shelle Iron.  He is also noted to  have elevated glucoses and a hemoglobin A1C was slightly elevated at 6.7.  He was a former smoker between the ages of 6 and 36 but none for the past  30 years.  During his admission, a cardiac catheterization was performed.  He was consented for bypass surgery, but because of diffuse pulmonary  complications probably related to acute pneumonia at the time of his  admission.  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  He was scheduled to see Dr. Sheliah Plane of [facility] on an outpatient basis to reconsider coronary artery bypass  grafting surgery.  Of note, cardiac catheterization findings showed left  main with normal circumflex.  Circumflex was large codominant artery with a  large obtuse marginal that covered the entire wall.  These views appeared to  be approximately 40-50%.  Others appeared that there was no lesion of the  obtuse marginal.  There was a diagonal vessel that was 90% proximally.  The  LAD was totally occluded with retrograde filling.  Right coronary artery was  occluded in the mid portion.  There is very little distal right artery  visualized.  Repeat hand-held PFT's done on an outpatient basis revealed an  FEV1 of 1.7 and FVC of 2.47.  He was seen by Dr. Tyrone Sage at the [facility] office  on January 03, 2005.  After exam of the patient and review of his history and  findings, with regard to both his pulmonary status and hemoglobin were  stable, Dr. Tyrone Sage felt that it was appropriate to proceed with coronary  artery bypass  grafting surgery.  After discussing risks and benefits, Mr.  Waltman agreed to proceed.   HOSPITAL COURSE:  On January 08, 2005, Mr. Franklin was electively admitted to  [facility] to undergo coronary artery bypass grafting, as discussed  above.  There were no known intraoperative complications, and the patient  was transferred to the surgical intensive care unit in hemodynamically  stable condition.  On postoperative day #1, Mr. Hurrell had remained stable  overnight.  Telemetry showed sinus rhythm.  He did require dopamine drip.  Chest tube output was only 80 cc over the last eight hours.  Postoperative  hemoglobin and hematocrit were stable at 11 and 34, respectively.  Blood  sugars were under good control with Glucometer with transition to Lantus  insulin.  Chest x-ray did show mild vascular congestion.  Exam did show  upper airway rhonchi.  He was started on diuretic therapy as well as  pulmonary regimen, pulmonary toilet, and Spiriva, Pulmicort, and Ventolin  nebulizer treatment.  Of note, he had been extubated neurologically intact.  He was restarted on low-dose beta blocker early on with his history of  atrial fibrillation.   On postoperative day #2, Mr. Cousineau was felt appropriate to transfer out of  the unit and out to the floor.  By this time, the chest tubes had been  discontinued without incident.  He had also been weaned successfully from  supplemental oxygen.   On postoperative day #3, Mr. Strege continues to have signs of good  progression.  His latest vital signs at the time of dictation showed a blood  pressure of 119/70, heart rate in the 70s and 80s in a sinus rhythm.  He was  afebrile and saturating 93% on room air.  Due to his history of being a  Jehovah's Witness and the fact that he had undergone coronary artery bypass  graft surgery, he was started on folic acid, ferrous sulfate, and Aranesp  per pharmacy protocol to assist in maintaining his white count.   Labs did show just mild anemia with a hemoglobin of 11.5 and a hematocrit of 34 after  several days postop.  His renal function remained stable.  His creatinine is  0.7.  His blood glucose levels remained relatively good, in relatively good  control, now on sliding-scale insulin and his home regimen of Glucotrol.  He  was also on  carbohydrate-modified diet and tolerating it well.  His bowels  and bladder were still functioning appropriately.  He did show signs of mild  volume excess with weight up approximately 7 pounds but was responsive to  diuretic therapy.  On exam, his heart had a regular rate and rhythm.  Lung  sounds showed a few bibasilar crackles.  Abdominal exam was benign.  His  extremities showed trace to 1+ edema and bilateral ankles and feet.  His leg  incisions were clean, dry, and intact; however, sternal incision did have  some serosanguineous drainage in both the proximal and distal aspects of his  incision.  There was mild erythema along the aspect of his distal incision.  Initially, this was treated with painting of his incision with Betadine and  dry gauze dressing.  If his drainage does not resolve or if there is any  significant erythema, antibiotics will be considered as appropriate.   Mr. Acuna pain was also controlled on oral medication.  He was also  ambulating in the hallways independently with a steady gait.  He did report  mild dyspnea on exertion but was otherwise without any significant shortness  of breath or chest pain.  It is felt that if Mr. Rodger continues to  progress in this manner and if his sternal incision remains stable, he may  be ready for discharge home on postoperative day #5 on January 13, 2005.   Most recent labs at the time of this dictation show an iron of 13, total  iron-binding capacity of 217, percent saturation of 6, folate 6.4, blood  ferritin 454, vitamin B12 325.  Sodium 139, potassium 3.7, chloride 102, CO2  26, blood glucose  118, BUN 8, creatinine 0.7, calcium 8.8.  White blood  count 10.4, hemoglobin 11.5, hematocrit 34, platelet count 230.  Digoxin  level is 0.4.  Reticulocyte percentage of 1.3 with RBC of 3.54.  Absolute  reticulocytes of 46.  Magnesium of 2.3.  Preoperative hemoglobin A1C of 6.7  with liver function tests showing total bilirubin 0.5, alkaline phosphatase  117, SGOT 19, SGPT 21, total protein 6.5.  Blood albumin 3.7.  Total  cholesterol was 139, triglycerides 160, HDL 25, LDL 82, total cholesterol  HDL ratio of 5.6.   His chest x-ray on July 14th showed no significant change in bibasilar  atelectasis and small effusion.  There was mild cardiomegaly without change.   DISCHARGE MEDICATIONS:  1.  Enteric-coated aspirin 325 mg daily.  2.  Toprol XL 25 mg daily.  3.  Lisinopril 5 mg daily.  4.  Zocor 40 mg daily.  5.  Lanoxin 0.25 mg daily.  6.  Spiriva 18 mcg via hand-held daily.  7.  Protonix 40 mg daily.  8.  Glucotrol 2.5 mg daily.  9.  Reglan 10 mg q.i.d.  10. Folic acid 1 mg daily. 11. Ferrous sulfate 325 mg twice daily x1 month.  12. Lasix 20 mg daily.  13. K-Dur 20 mEq twice daily.  14. He is to continue his Pulmicort, Xopenex, and nebulizer treatments per      his home regimen, which is documented as 4 times a day.  15. Tylox 1-2 tablets p.o. q.4-6h. p.r.n. pain.   DISCHARGE INSTRUCTIONS:  He is instructed to avoid driving, heavy lifting  more than 10 pounds.  He was encouraged to continue daily walking and  breathing exercises.  He is to follow a low salt, low fat, carbohydrate-  modified diet.  He may shower and clean his  incision gently with mild soap  and water.  He should notify the [facility] office if he develops fever greater  than 101, new drainage from his incision, particularly purulent drainage or  for opening of his incision.   FOLLOW UP:  He is to follow up with Dr. Sheliah Plane of the [facility] office  on February 14, 2005 at 11:30 a.m.  He is to have a chest x-ray one  hour  before at [facility] and was instructed to bring in his chest x-  ray with him to the [facility] office.  Next, he is to call [phone] to schedule a  two-week followup with Dr. Julieanne Manson.       AWZ/MEDQ  D:  01/11/2005  T:  01/11/2005  Job:  [id number]   cc:   Thereasa Solo. Little, M.D.  [address]  [address]  [place]  [place] [address]  Fax: [phone]   Dr. Jeannett Senior Hinx  [place], [place]   Anselm Pancoast. Zachery Dakins, M.D.  [address], [address]  [place]  [place] [address]

## 2010-11-16 NOTE — H&P (Signed)
[name], [name]                            ACCOUNT NO.:  [id number]   MEDICAL RECORD NO.:  [id number]                   PATIENT TYPE:  INP   LOCATION:  0448                                 FACILITY:  [facility]   PHYSICIAN:  Anselm Pancoast. Zachery Dakins, M.D.          DATE OF BIRTH:  03/20/1928   DATE OF ADMISSION:  04/25/2003  DATE OF DISCHARGE:                                HISTORY & PHYSICAL   PREOPERATIVE DIAGNOSIS:  Multiple incisional hernias with partial bowel  obstruction.   HISTORY:  Nathaniel Lopez is a 77 year old Caucasian male who I first met  approximately a year ago when he was referred to Dr. Deterding's after  having a seroma in the abdominal wall drained by Dr. Orson Slick after he had a  ventral hernia repair laparoscopically.  Physician, Dr. Quincy Sheehan in [facility]  The patient has had multiple problems with abdominal surgeries and  hernias.  Started off with a colon cancer through a left paramedian incision  and a colostomy in the right upper quadrant, then had a colostomy take down.  This was in 1989, two operations.  In 1990, he had an open cholecystectomy  through a subcostal incision, then had a hernia repair in August 2000,  through a midline incision.  Hernia reoccurred and had the laparoscopic  surgery in May 2002, and then I saw him in I think it was August or  September.  He had a serum accumulation where the hernia mesh had been  placed, and he developed a small bowel obstruction when he incarcerated  through the fascial defect, and had an exploratory laparotomy, removal of  the infected mesh, and attempted closure of the area here at [facility] in  September 2003.  The patient has been followed in the office, and has  developed recurrent hernias and he has been wearing an abdominal binder, but  this has not been controlling his symptoms, and then last week he started  having severe cramping and abdominal pain, and I saw him in the office on  Thursday.  I obtained  a CT of the abdomen and pelvis on Thursday evening,  and it showed two loops of small bowel above the anterior fascia.  One  looked like it was coming out through the mid abdomen, the other at the  umbilicus, but he also has a fascial defect up around the old colostomy  site, and then he has a weakness up in the top part of the incision.  I put  him on a liquid diet, and we kind of partially cleansed his colon, and he is  an urgent admission today for this attempted repair of these multiple  hernias.  He is on a baby aspirin as his only chronic medication, and this  was discontinued about a week ago.   ALLERGIES:  NO KNOWN DRUG ALLERGIES   FAMILY HISTORY:  Parents are deceased.  He has three brothers  living, one  deceased with TB.  Three sisters.   REVIEW OF SYSTEMS:  Essentially negative with the exception of his abdominal  hernias.  He has had a colonoscopy performed in July 2004, with no evidence  of any abnormalities noted in his colon.   PHYSICAL EXAMINATION:  VITAL SIGNS:  He is 5 feet 10 inches, weighs 215  pounds, temperature was 97.6, heart rate was 85, respirations 24, blood  pressure is 160/86.  HEENT:  He appears adequately hydrated.  NECK:  No cervical or supraclavicular adenopathy.  LUNGS:  Good breath sounds bilaterally.  CARDIAC:  Normal sinus rhythm.  ABDOMEN:  He has this kind of a weakness in the midline incision, kind of a  puffiness to the right and the left of the midline incision.  There is  basically little stool in his rectum, and in the office the stool was  Hemoccult negative and soft.  EXTREMITIES:  No pedal edema  NEUROLOGIC:  Grossly physiologic.   ADMISSION IMPRESSION:  Multiple incisional hernias through multiple previous  abdominal surgeries.  I am planning on exploratory laparotomy through the  midline incision.  I am not sure exactly sure how we will repair these  numerous hernias.                                               Anselm Pancoast.  Zachery Dakins, M.D.    WJW/MEDQ  D:  04/25/2003  T:  04/25/2003  Job:  [id number]

## 2010-11-16 NOTE — Discharge Summary (Signed)
NAMESUEDE, GREENAWALT                            ACCOUNT NO.:  [id number]   MEDICAL RECORD NO.:  [id number]                   PATIENT TYPE:  INP   LOCATION:  0448                                 FACILITY:  [facility]   PHYSICIAN:  Anselm Pancoast. Zachery Dakins, M.D.          DATE OF BIRTH:  09-29-1927   DATE OF ADMISSION:  04/25/2003  DATE OF DISCHARGE:  05/06/2003                                 DISCHARGE SUMMARY   DISCHARGE DIAGNOSES:  1. Partial small bowel obstruction secondary to multiple incarcerated     incisional hernias.  2. History of previous colon cancer.   OPERATION:  1. Exploratory laparotomy.  2. Lysis of adhesions.  3. Repair of multiple ventral hernias with mesh.   HISTORY:  Nathaniel Lopez is a 77 year old Caucasian male who was referred to  me by Fayrene Fearing L. Deterding, M.D. whose employs the patient's daughter  approximately a year and a half ago after he has had multiple problems with  hernias following multiple operations.  The patient's first surgery was in  1989 when he had a left paramedian for an obstructive colon cancer.  Had a  colostomy in the right upper quadrant, then a colostomy takedown.  He had a  cholecystectomy, I think, open in 1990 and this was a right subcostal and  then he has developed incisional hernias.  Had an open repair and also  laparoscopic repairs.  On the open repair mesh was placed, but became  infected and at the time of the open repair he was noted to have a fistula  that was repaired.  In reviewing the operative note this was in 2000.  In  August or September of 2002 he had a laparoscopic repair of the central  hernia and I saw him about six months later when he continued to have serous  drainage and actually he had been seen in the emergency room and the area  opened up by Lebron Conners, M.D. and Fayrene Fearing L. Deterding, M.D. asked if I  would see him.  We had a drain in.  The seroma continued and the mesh that  had been placed laparoscopically  separated, worked up into the hernia sac  and then this was removed in September 2002 and then for the next year I  tried to manage him with abdominal binders.  He had had a CT that showed  basically he has incisional hernias in all of these incisions, the midline  where he has had the open surgery, the lower abdomen close to the paramedian  and he got a hernia up where he has had the colostomy in the right upper  quadrant and the medial aspect of the gallbladder incision, multiple ventral  hernias.  I saw him recently when he started having bloating, cramping, and  nausea and repeated a CT on an urgent basis and this showed really he has  three loops of bowel up above the fascia  that the binder was kind of pushing  on and obviously we have no choice but to go ahead and try to repair these.  I asked Adolph Pollack, M.D. to see him during one of the visits on  whether or not a laparoscopic repair with possible tensioning because of the  multiple hernias.  With this incarceration, though, I put him on a liquid  diet to kind of clean out his colon and electively on an urgent admission on  the 25th of October for repair.  He was taken to surgery and I opened the  mid portion of his incision that had really been a laparoscopic repair and  there may be some mesh down around the umbilicus that I did not actually  remove, but went through it but then he had a large loop of bowel coming  through the lower aspect to the left colon up through the fascia where he  had had the colostomy and then there was a smaller hernia up at the superior  aspect of the subcostal incision and basically I did a complete lysis of  adhesions, fused the piece of Prolene mesh placed intraperitoneally into the  top 3/4 of the incisions.  This was actually anchored down within the  preperitoneal space.  He has a pretty good omentum here and tried to fix it  so that the colon and small bowel is not in contact with the mesh  that was  placed intra-abdominally.  I did bring out some sutures through the  abdominal wall like we do with the laparoscopic repairs and kind of anchor  the mesh and then closed the fascia over the area.  At the inferior aspect,  but really there was no omentum and could not develop a peritoneal plane to  get the mesh on the outside.  I actually repaired the fascia and then put a  small piece of Prolene mesh over the inferior half or third of the incision  to kind of reinforce this.  I did have a Blake drain and the patient for  about a week basically had kind of an ileus partially obstructed then the  colon started finally working and the NG tube was removed.  The last 48  hours he had the NG tube we checked the x-rays.  This showed that all the  gas was in the colon and I also started giving him Milk of Magnesia on a  daily basis.  We have had him on a liquid diet now for about three days  which he has tolerated.  He had one small emesis last night but his abdomen  was soft.  I removed the Blake drain and removed about half of the skin  staples.  I think he can be discharged with follow-up in the office mid  week.  I would like for him to resume his baby aspirin on a daily basis and  also Darvocet-N 100 for incisional pain if needed.  He will wear an  abdominal binder.  The lower portion of the incision where the mesh is above  the fascia there is a little bit of bruising but there is no evidence of any  infection and hopefully this will all heal without having chronic problems  or recurrent hernia.  I do want him to wear an abdominal binder for about  six weeks but he has been wearing this previously.  The patient is on no  chronic medications for blood pressure, etc. and is  discharged in improved  postoperative condition.                                               Anselm Pancoast. Zachery Dakins, M.D.   WJW/MEDQ  D:  05/06/2003  T:  05/06/2003  Job:  [id number]   cc:   Fayrene Fearing L. Deterding,  M.D.  [address]  [place]  [place] [address]  Fax: [phone]

## 2010-11-16 NOTE — Cardiovascular Report (Signed)
NAME:  Nathaniel Lopez, Nathaniel Lopez                ACCOUNT NO.:  [id number]   MEDICAL RECORD NO.:  [id number]          PATIENT TYPE:  INP   LOCATION:                               FACILITY:  [facility]   PHYSICIAN:  Thereasa Solo. Little, M.D. DATE OF BIRTH:  [birth year]   DATE OF PROCEDURE:  12/03/2004  DATE OF DISCHARGE:                              CARDIAC CATHETERIZATION   INDICATIONS FOR TEST:  This 77 year old male was admitted on November 30, 2004  with rapid atrial fibrillation and chest discomfort; had positive cardiac  enzymes with nonspecific EKG changes.  This is all consistent with a non-Q-  wave MI. He is pain free brought to the cath lab.   After obtaining informed consent the patient was prepped and draped in the  usual sterile fashion exposing the right groin.  Following local anesthetic  with 1% Xylocaine the Seldinger technique was employed and a 5-French  introducer sheath was placed in the right femoral artery.  Left and right  coronary arteriography, visualization of the IMA, ventriculography in the  RAO projection and a distal aortogram was performed.   COMPLICATIONS:  None.   EQUIPMENT:  5-French diagnostic Judkins configuration catheters.   TOTAL CONTRAST:  105 mL   RESULTS   I. HEMODYNAMIC MONITORING:  Central aortic pressure 121/58. Left ventricular  pressure 121 over 12; and there was no aortic valve gradient noted at the  time of pullback.   II VENTRICULOGRAPHY:  Ventriculography in the RAO projection revealed no  focal wall motion abnormalities and ejection fraction of greater than 55%.  The left ventricular end-diastolic pressure was 17.   DISTAL AORTOGRAM:  A distal aortogram done at the level of the renal artery  showed no renal artery stenosis and no abdominal aortic aneurysm and no  proximal iliac disease.   Left internal mammary artery widely patent.   CORONARY ARTERIOGRAPHY:  1.  Left main normal.  2.  Circumflex.  The circumflex was a large codominant vessel with  a very      large OM that covered the entire lateral wall. This system was free of      disease.  3.  Optional diagonal moderate size vessel with 90% proximal area of      narrowing.  4.  LAD. The LAD was occluded almost to its ostium and filled late and      retrograde all the way back almost to the left main. It appeared to be      about 2 mm filling retrograde.  5.  Right coronary artery. The right coronary was 100% occluded in its      midportion and the distal right was visualized via the left-to-right      collaterals.   CONCLUSION:  1.  Normal LV systolic function.  2.  No renal artery stenosis.  3.  History abdominal aortic aneurysm.  4.  Severe 2-vessel disease with total occlusion of the LAD with retrograde      filling; total occlusion RCA with left-to-right collaterals; 90%      proximal optional diagonal disease.   It appears that the  optional diagonal lesion is responsible for his non-Q-  wave MI. The LAD and RCA disease is probably old in view of the collateral  filling and no wall motion abnormalities.   I have asked CVTS to evaluate the patient for consideration for  revascularization. My biggest concern is the severity of his COPD; and he  has already been evaluated by pulmonary. His optional diagonal lesion;  however, is so proximal that I do not feel that it could be safely  intervened upon percutaneously.       ___________________________________________  Thereasa Solo Little, M.D.    ABL/MEDQ  D:  12/03/2004  T:  12/03/2004  Job:  [id number]   cc:   Jeannett Senior Hux  [address]  [place]  [place] [address]  Fax: [phone]   Jonna L. Robb Matar, M.D.   Cath Lab   Gaspar Garbe B. Little, M.D.  [address]  [address]  [place]  [place] [address]  Fax: [phone]

## 2010-11-16 NOTE — Op Note (Signed)
TNAMEERNST, CUMPSTON                           ACCOUNT NO.:  [id number]   MEDICAL RECORD NO.:  [id number]                   PATIENT TYPE:  INP   LOCATION:  0449                                 FACILITY:  [facility]   PHYSICIAN:  Anselm Pancoast. Zachery Dakins, M.D.          DATE OF BIRTH:  1927/09/28   DATE OF PROCEDURE:  02/18/2002  DATE OF DISCHARGE:                                 OPERATIVE REPORT   PREOPERATIVE DIAGNOSIS:  Small bowel obstruction secondary to dehiscence of  recent repaired incisional hernia.   POSTOPERATIVE DIAGNOSIS:  Small bowel obstruction secondary to dehiscence of  recent repaired incisional hernia.   OPERATION:  Exploratory laparotomy, release of a mechanical small bowel  obstruction secondary to adhesions and repair of fascia no mesh.   ANESTHESIA:  General.   SURGEON:  Anselm Pancoast. Zachery Dakins, M.D.   ASSISTANT:  Gabrielle Dare. Janee Morn, MD   HISTORY:  Nathaniel Lopez is a 77 year old Caucasian male who was admitted by  Dr. Orson Slick here recently about a month ago with a problem with his abdominal  incision following a hernia repair that had been performed for a reoccurring  incisional hernia in [facility]. The patient had had a previous colectomy  years ago, previous cholecystectomy, I think other abdominal surgeries. He  had developed an incisional hernia and this was repaired with mesh  approximately a year ago. Approximately 3-4 months ago, he developed an  infection swelling in the area and had a repeat which he informed me that  the mesh was removed and then afterwards originally he had a Jackson-Pratt  drain, continued to have drainage. The drain was removed and he then became  disenchanted with his care and elected to come to [facility] with his wife  being Dr. Rosanne Ashing Deterding's nurse and I was suppose to see him in the office.  However prior to that time, he developed an acute problem and was seen in  the emergency room by Dr. Marcy Panning who did a Gastrografin  barium enema  that showed no colon obstruction. He than also placed a Jackson-Pratt drain  in the seroma of the abdominal wall and I saw the patient in the office  about a week later. He was having about 200 cc of serous drainage over a 24  hour period and I cultured the fluid and it did not grow anything. During  the past couple of weeks, he has been seen by two other physicians in my  absence while on vacation. I saw him on yesterday on a routine visit and he  said he had cramping, abdominal pain, nausea, vomiting and also had a  significant increase in the quantity of drainage over the past few days. I  then sent him back over here for lab studies and a CT of the abdomen. The  lab studies were unremarkable. The CT showed really a mechanical small bowel  obstruction with a separation through the midline.  He definitely does have  mesh in place and the mesh looked like it had dehisced. I recommended that  we admit him, place him on NG fluid, suction and put him on Kefzol and plan  for a laparotomy today. His abdomen is softer today and I gave him a gram of  Kefzol immediately preoperatively, PAS stockings and took him to the  operative suite.   Induction of general anesthesia, endotracheal tube, of course he has got the  NG tube. A Foley catheter was inserted sterilely and then the first thing I  did is remove the JP drain before he was prepped with Betadine surgical  solution and draped in a sterile manner. I then made a small incision  midline in the old excision and very quickly opened into this large serous  cavity and noted that he has got a large piece of this double mesh that is  placed onlay over the fascia. It had separated on the left side sewn at the  umbilicus and had a large loop of small bowel that had come up through the  fascia defect. The fascia defect was about 2 inches in size. The defect was  large enough that that is why the bowel was certainly viable and I elected   to basically first carefully place the small bowel back in the peritoneal  cavity and then kind of assess what was going on and there was an area also  to the left of the fascia that is thinned out as if he has developed another  little incisional hernia there with a bridge of fairly heavy fascia between  the two weaknesses and I carefully separated this making sure there was no  intestines in this area. With this, I now had an opening probably about 3-4  inches in size. I had removed this mesh which was basically freed  circumferentially. It was a little bit stuck down and it just came off  easily and obviously was not being incorporated into his abdominal wall and  replaced with the stitches that are supplied with the mesh and also these  little wire screws but it was on top of the fascia and not within the  peritoneal cavity. With this, I then went ahead and basically through this  little incision could do a complete or at least inspect the small bowel, and  the proximal two-thirds of the small bowel was just very thick chronically  dilated and then down in the lower abdomen, there were some very dense  adhesions in the left lower quadrant which the small bowel was kind of  adherent against. I think that this is actually the point of this colonic  obstruction and not that of the incisional hernia since distal to that dense  adhesion the small bowel was decompressed and it collapsed. I did have one  little mucosal pin hole opening in taking down this very dense adhesion and  I closed it with 3-0 silk sutures in two layers. I kind of enclosed the  little opening and closed the little serosa over it transversely. There was  another little area that looks like it is kind of chronic as it to me may be  the mucosal is intact the serosal looks like being separated from the  previous surgery or possibility pushing upon the mesh and this was closed also transversely with interrupted 3-0 silks. I  then thoroughly irrigated  the peritoneal cavity. I sent a sample of fluid on first opening the  defect  for Gram stain and they said they see a few white cells but no organisms.  The remaining part of the fascia really appears reasonably strong and I  think that the best option is to try to close him without using any type of  mesh or foreign material at this time and then use an abdominal binder and  if he does get a repeat incisional hernia down below hopefully it can be  repaired placing the mesh in the preperitoneal space. At the time of surgery  on the laparotomy, I found no evidence of any metastatic disease or  questionable worrisome problems. The fascia then was closed with interrupted  #0 Prolene sutures in kind of big bites not tied down extremely tight so  hopefully the fascia want necrose, and then I placed a 19 Blake drain in  this large serum cavity and did not actually remove this little  pseudocapsule since I think the reaction is more related to the mesh and not  that of an obvious infection and this is definitely not peritoneal. The skin  was then closed with staples, the drain anchored to the skin and after a  sterile occlusive dressing had been applied an abdominal binder placed on  the patient. The patient tolerated the procedure nicely and was extubated  prior to going to the recovery room and sponge and needle counts were  correct x2. I am going to keep the nasogastric tube until he is definitely  having bowel function and we will keep him on Kefzol postoperatively  awaiting the results of the cultures. If we would show multiple organisms, I  definitely want to open up the skin but the preliminary appearance of the  fluid and the Gram stain did not show any obvious infection at this time.                                                Anselm Pancoast. Zachery Dakins, M.D.    WJW/MEDQ  D:  02/18/2002  T:  02/19/2002  Job:  [id number]

## 2010-11-16 NOTE — Op Note (Signed)
Nathaniel Lopez, Nathaniel Lopez                ACCOUNT NO.:  [id number]   MEDICAL RECORD NO.:  [id number]          PATIENT TYPE:  INP   LOCATION:  2304                         FACILITY:  [facility]   PHYSICIAN:  Sheliah Plane, MD    DATE OF BIRTH:  1927/11/17   DATE OF PROCEDURE:  01/08/2005  DATE OF DISCHARGE:                                 OPERATIVE REPORT   PREOPERATIVE DIAGNOSIS:  Coronary occlusive disease.   POSTOPERATIVE DIAGNOSIS:  Coronary occlusive disease.   SURGICAL PROCEDURE:  Off-pump coronary artery bypass grafting x3 with the  left internal mammary to the left anterior descending coronary artery,  reverse saphenous vein graft to the intermediate coronary artery, and  reverse saphenous vein graft to the posterior descending coronary with Endo-  Vein harvest attempted, right thigh, completed at the left thigh, and open  vein harvest to right lower leg.   SURGEON:  Sheliah Plane, MD.   FIRST ASSISTANT:  Kerin Perna, MD.   SECOND ASSISTANT:  Gershon Crane, [place].   BRIEF HISTORY:  The patient is a 77 year old Jehovah's Witness, who was  admitted with acute respiratory problems approximately three to four weeks  prior to surgery.  During his admission, his troponins were elevated, a  Cardiology consult was obtained, and the patient underwent cardiac  catheterization, which revealed total occlusion of his LAD, total occlusion  of his right coronary artery, a high-grade stenosis in an intermediate  coronary artery, and a patent circumflex dominant vessel.  Overall  ventricular function was preserved.  During the patient's previous  admission, he had significant respiratory problems with cough, severe  congestion, and wheezing.  He has been treated for his respiratory  difficulties and these have improved over the past several weeks.  The  patient is a Industrial/product designer Witness and after extensive discussion  refused any blood products.  After several weeks of intensive  pulmonary  therapy, he gradually improved and we decided to proceed with coronary  artery bypass grafting.   DESCRIPTION OF PROCEDURE:  With Swan-Ganz and arterial line monitors in  place, the patient underwent general endotracheal anesthesia without  incident.  The skin, chest, and legs were prepped with Betadine and draped  in the usual manner.  Using the Guidant Endo-Vein harvesting system,  initially attempt was made to harvest vein from the right thigh, but a  satisfactory vein could not be located.  An incision was made in the left  thigh and at the left knee and an adequate vein was identified and using the  Guidant Endo-Vein harvesting system a segment of vein was harvested from the  thigh.  One segment of this was suitable for bypass.  The other part was a  bifurcated system and was too small to use.  For this reason, additional  vein was harvested in an open technique from the right ankle.  A median  sternotomy was formed and a left internal mammary artery was dissected down  as a pedicle graft, the distal artery divided and had good free flow.  It  was not clear from the preop films if the  right coronary artery would be  suitable for bypass; however, the PD was identified and was a reasonable  sized vessel.  The patient was systemically heparinized using the Guidant  stabilization system.  It was felt that a three-vessel bypass would be  technically feasible.  A partial occlusion clamp was placed on the ascending  aorta and two punch aortotomies were performed in each of the two vein  grafts and were anastomosed to the ascending aorta.  Attention was then  turned to the posterior descending coronary artery in which stabilized  vessel loops were used for hemostasis.  The vessel was opened and, using a  running 7-0 Prolene, the distal vein anastomosis was carried out.  Prior to  completion of the anastomosis, the bulldog was removed from the vein and the  anastomosis was  completed, and blood flow was restored into the posterior  descending coronary artery.  In a similar fashion using the Guidant  stabilization device, the heart was elevated and the partially  intramyocardial intermediate coronary artery was identified.  Vessel loops  were placed around the vessel, it was opened and admitted a 1.5-mm probe.  Using a running 7-0 Prolene suture, this segment of reverse saphenous vein  graft was anastomosed to the intermediate, the bulldog was removed from the  graft, restoring flow through the intermediate.  Attention was then turned  to the left anterior descending coronary artery, which was stabilized and  opened.  It was partially intramyocardial but was able to be dissected out  and identified.  Vessel loops were placed, the artery was opened, and a 1.5-  mm probe passed proximally and distally.  Using a running 8-0 Prolene, the  left internal mammary artery was anastomosed to the left anterior descending  coronary artery, the bulldog was removed from the mammary artery, and the  anastomosis appeared without bleeding.  The fascia was tacked to the  epicardium, all the stabilization devices were removed.  During this  procedure, the patient remained hemodynamically stable with minimal blood  loss.  Protamine sulfate was administered, graft markers were applied, two  atrial and two ventricular pacing wires were applied.  The pericardium was  reapproximated and the sternum closed with #6 stainless steel wires, the  fascia was closed with interrupted 0 Vicryl, running 3-0 Vicryl in the  subcutaneous tissue, and a 4-0 subcuticular stitch in the skin edges.  Dry  dressings were applied.  Sponge and needle count was reported as correct at  the completion of the procedure.  The patient tolerated the procedure  without obvious complication and was transferred to the surgical intensive  care unit for further postoperative care.      EG/MEDQ  D:  01/08/2005  T:   01/08/2005  Job:  [id number]   cc:   Marcelyn Bruins, M.D. [facility]   Alfred B. Little, M.D.  [address]  [address]  [place]  [place] [address]  Fax: [phone]

## 2010-11-19 ENCOUNTER — Other Ambulatory Visit (HOSPITAL_BASED_OUTPATIENT_CLINIC_OR_DEPARTMENT_OTHER): Payer: Self-pay | Admitting: Internal Medicine

## 2010-11-19 DIAGNOSIS — K869 Disease of pancreas, unspecified: Secondary | ICD-10-CM

## 2010-11-23 ENCOUNTER — Ambulatory Visit
Admission: RE | Admit: 2010-11-23 | Discharge: 2010-11-23 | Disposition: A | Discharge status: Home / Self Care | Payer: Medicare Other | Source: Ambulatory Visit | Attending: Internal Medicine | Admitting: Internal Medicine

## 2010-11-23 ENCOUNTER — Other Ambulatory Visit (HOSPITAL_BASED_OUTPATIENT_CLINIC_OR_DEPARTMENT_OTHER): Payer: Self-pay | Admitting: Internal Medicine

## 2010-11-23 DIAGNOSIS — K869 Disease of pancreas, unspecified: Secondary | ICD-10-CM

## 2010-11-23 MED ORDER — GADOBENATE DIMEGLUMINE 529 MG/ML IV SOLN
19.0000 mL | Freq: Once | INTRAVENOUS | Status: AC | PRN
  Administered 2010-11-23: 19 mL via INTRAVENOUS

## 2011-03-25 ENCOUNTER — Other Ambulatory Visit (HOSPITAL_BASED_OUTPATIENT_CLINIC_OR_DEPARTMENT_OTHER): Payer: Self-pay | Admitting: Internal Medicine

## 2011-03-25 DIAGNOSIS — K862 Cyst of pancreas: Secondary | ICD-10-CM

## 2011-05-14 ENCOUNTER — Ambulatory Visit: Payer: Medicare Other | Admitting: Pulmonary Disease

## 2011-05-23 IMAGING — CT CT HEAD W/O CM
2 series · 15 of 30 positions shown, 19 images · non-contrast
Comparison: 09/10/2009

CLINICAL DATA: Body tremors and tingling.

CT HEAD WITHOUT CONTRAST
TECHNIQUE: Contiguous axial images were obtained from the base of
the skull through the vertex without contrast.

[Series 3: head w/o · axial · non-contrast · 0.49mm/px · z∈[+93,+233]mm · 13 of 34 slices shown, 17 images]
[im 3/34  brain]
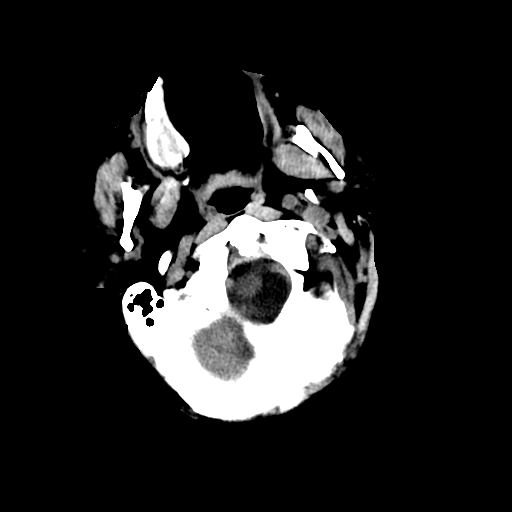
[im 3/34  bone]
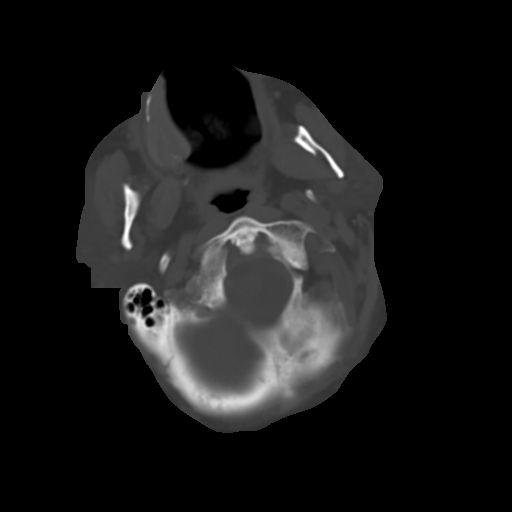
[im 5/34  brain]
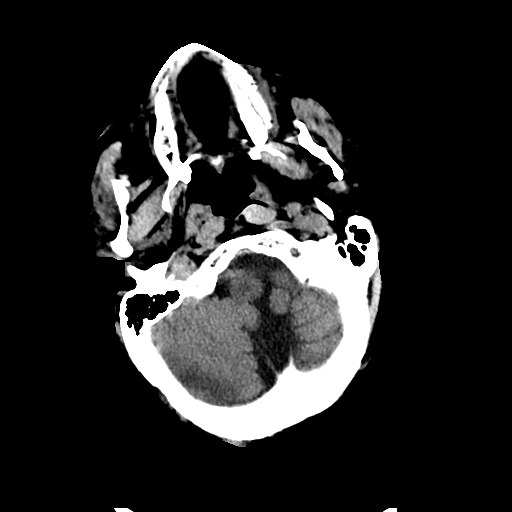
[im 8/34  brain]
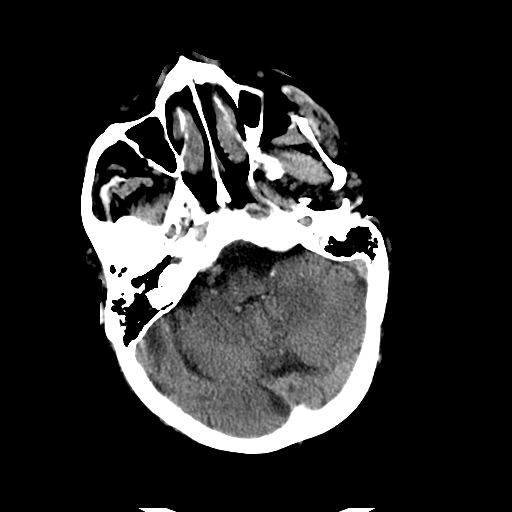
[im 10/34  brain]
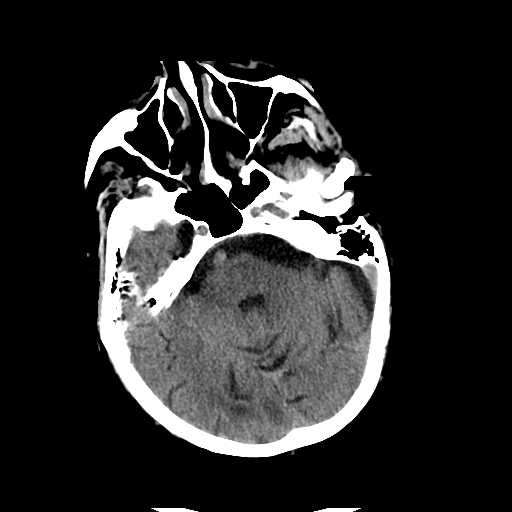
[im 12/34  brain]
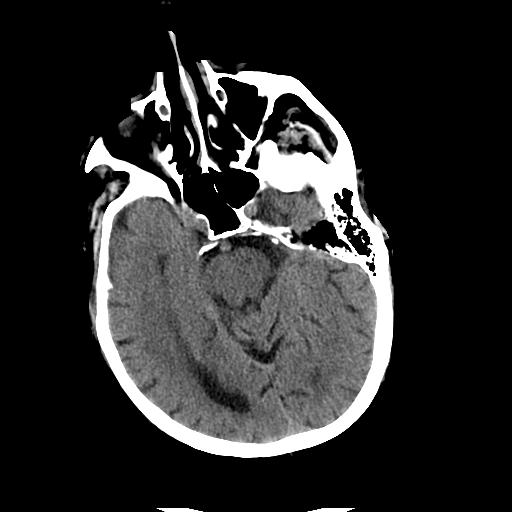
[im 12/34  bone]
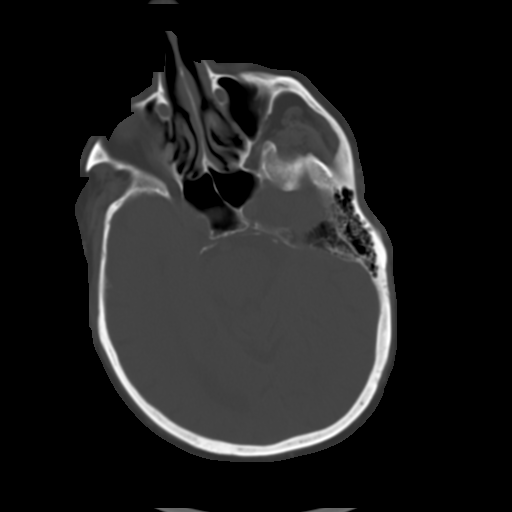
[im 15/34  brain]
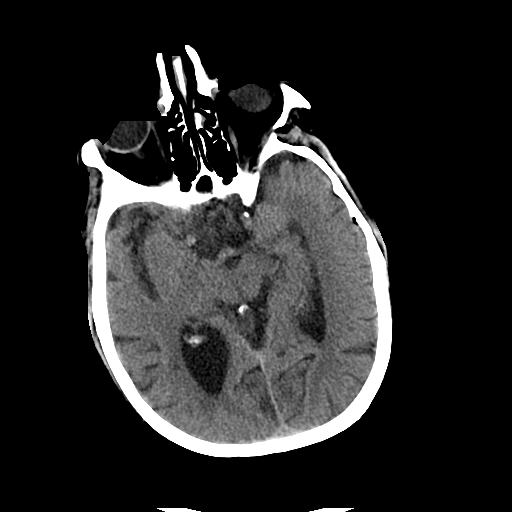
[im 17/34  brain]
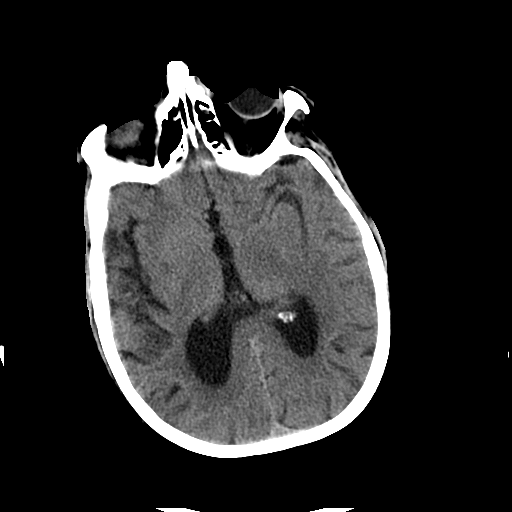
[im 19/34  brain]
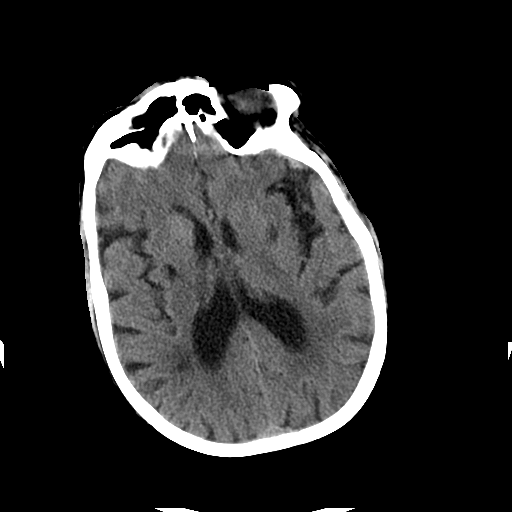
[im 22/34  brain]
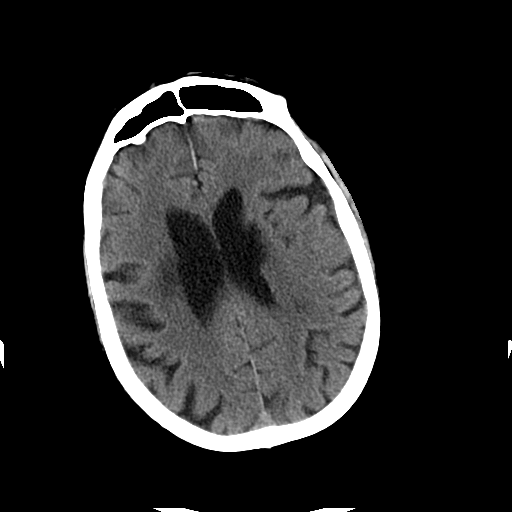
[im 22/34  bone]
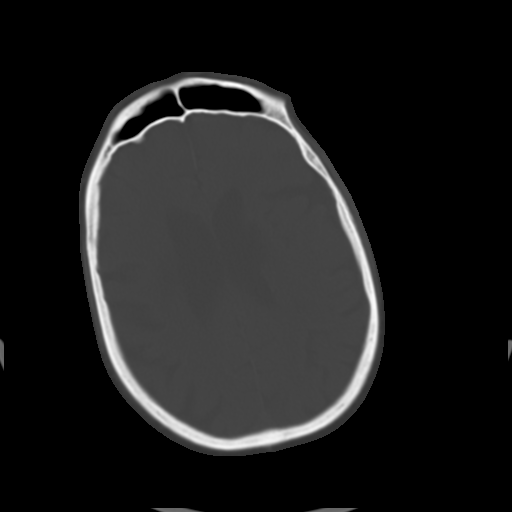
[im 24/34  brain]
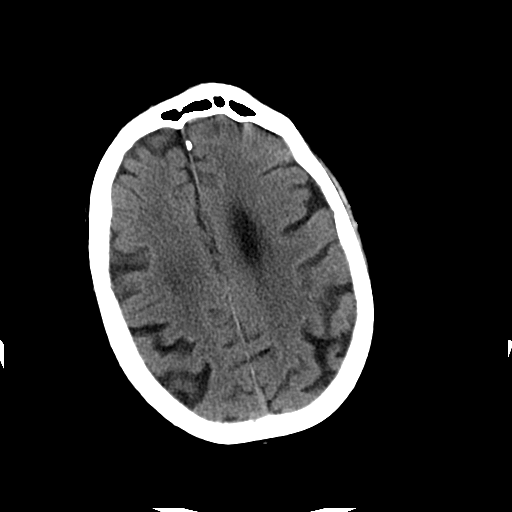
[im 26/34  brain]
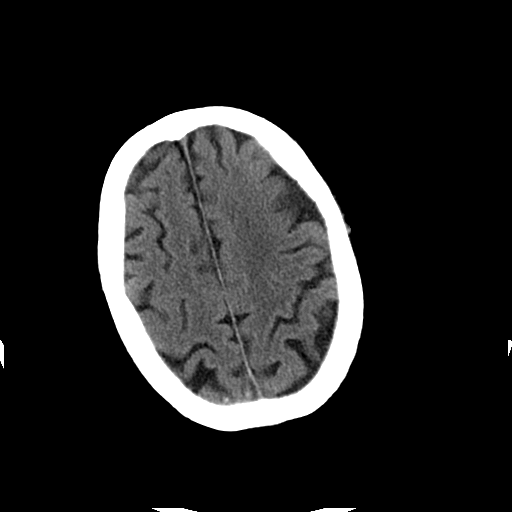
[im 29/34  brain]
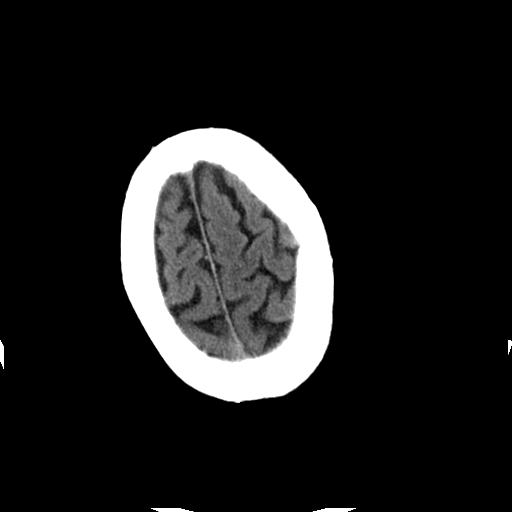
[im 31/34  brain]
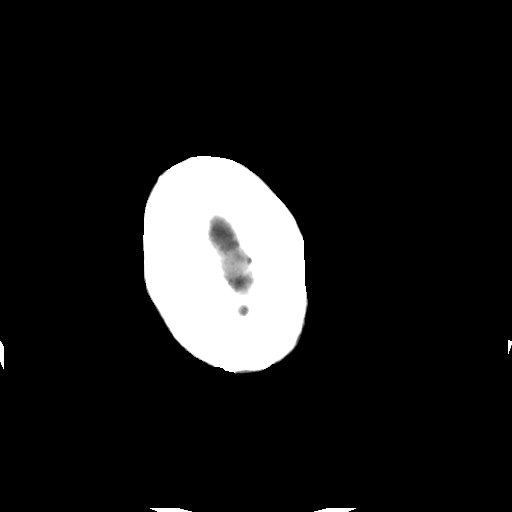
[im 31/34  bone]
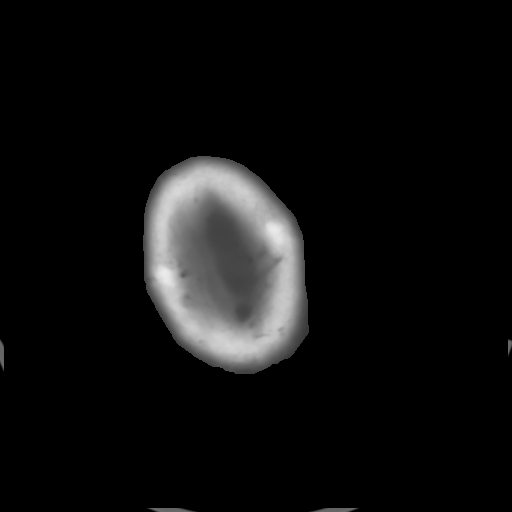

[Series 4: head w/o bone · axial · non-contrast · 0.49mm/px · z∈[+93,+118]mm · 2 of 34 slices shown]
[im 3/34  bone]
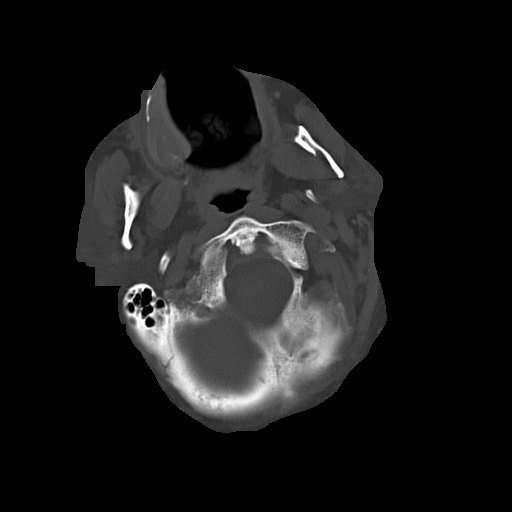
[im 8/34  bone]
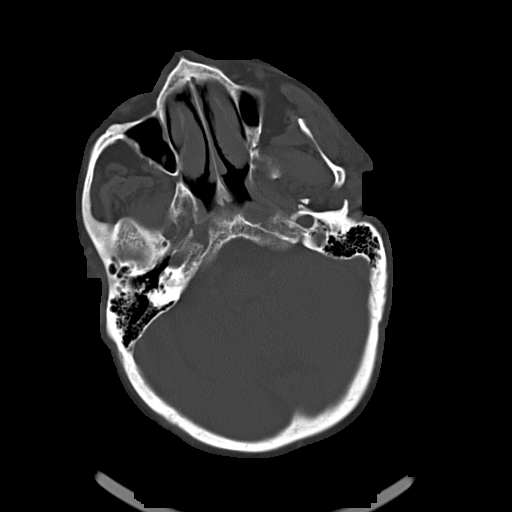

[15 of 30 positions shown; findings below may reference images not displayed]

FINDINGS: There is no evidence for acute hemorrhage, hydrocephalus,
mass lesion, or abnormal extra-axial fluid collection.  No definite
CT evidence for acute infarction.  Patchy low attenuation in the
deep hemispheric and periventricular white matter is nonspecific,
but likely reflects chronic microvascular ischemic demyelination.
As before, old bilateral lacunar infarcts of the basal ganglia are
evident.  Old lacunar infarct is seen in the left parietal region
and left occipital region.

The visualized paranasal sinuses and mastoid air cells are clear.
IMPRESSION: Stable exam.  No acute intracranial abnormality.

Atrophy with chronic small vessel ischemic white matter disease.

Chronic lacunar infarcts in the basal ganglia and left
parietal/occipital lobes.

## 2011-05-23 IMAGING — CR DG CHEST 1V
1 series · 1 of 1 positions shown · non-contrast
Comparison: 04/20/2010

CLINICAL DATA: Tremors.  Weakness.  Shortness of breath.

CHEST - 1 VIEW

[AP]
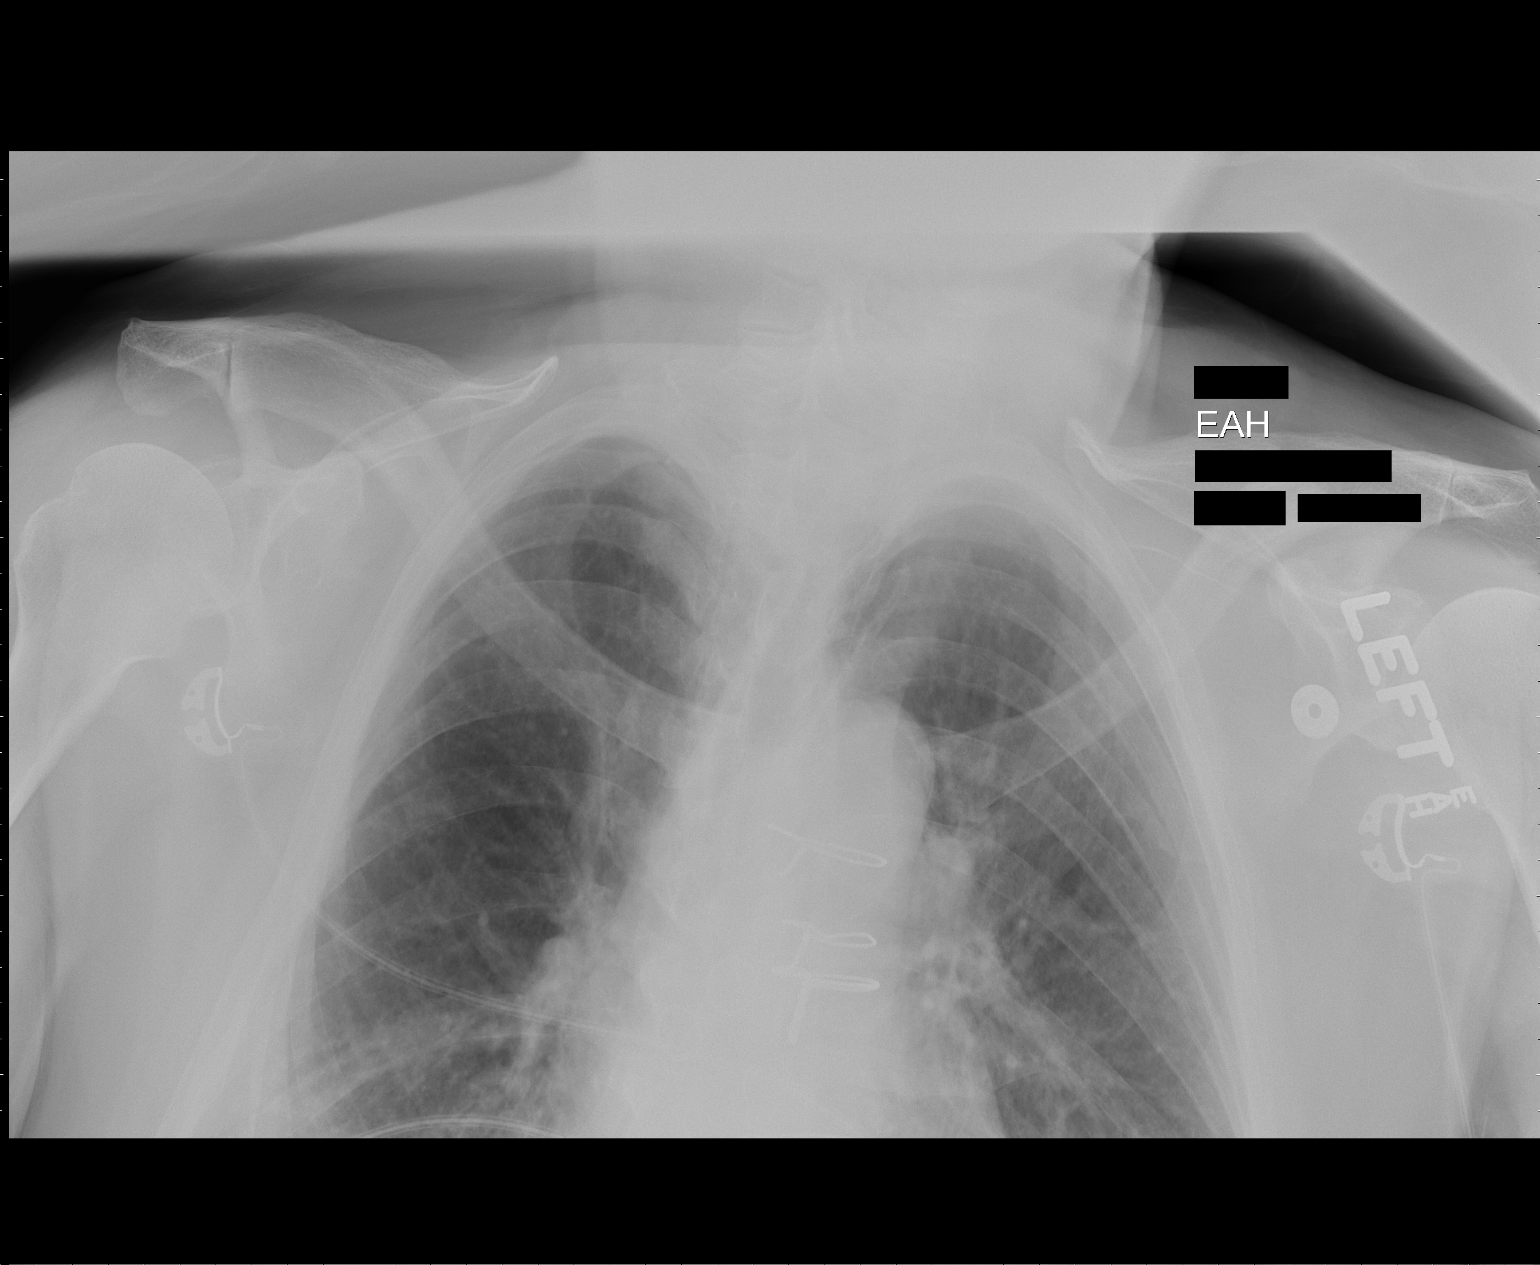

[1 of 1 positions shown; findings below may reference images not displayed]

FINDINGS: 8457 hours.  Lung volumes are low. The cardiopericardial
silhouette is enlarged. There is pulmonary vascular congestion
without overt pulmonary edema.  No overt airspace pulmonary edema
or focal lung consolidation. Telemetry leads overlie the chest.
IMPRESSION: Low volume film with cardiomegaly and vascular congestion.

## 2011-07-29 ENCOUNTER — Encounter: Payer: Self-pay | Admitting: Pulmonary Disease

## 2011-07-29 ENCOUNTER — Ambulatory Visit (INDEPENDENT_AMBULATORY_CARE_PROVIDER_SITE_OTHER): Payer: Medicare Other | Admitting: Pulmonary Disease

## 2011-07-29 VITALS — BP 150/80 | HR 88 | Temp 98.4°F | Ht 69.5 in | Wt 204.0 lb

## 2011-07-29 DIAGNOSIS — J449 Chronic obstructive pulmonary disease, unspecified: Secondary | ICD-10-CM

## 2011-07-29 NOTE — Patient Instructions (Signed)
Would recommend staying on spiriva one inhalation each am Continue albuterol inhaler for rescue. followup with me in 6mos to check on your breathing.

## 2011-07-29 NOTE — Progress Notes (Signed)
  Subjective:    Patient ID: Nathaniel Lopez, male    DOB: 1928-01-10, 78 y.o.   MRN: [id number]  HPI The patient comes in today for followup of his known significant COPD.  His daughter had felt that he was doing better on Spiriva, but since the last visit the patient has made a decision not to take any of his pulmonary medications.  Despite this, he is really been doing fairly well, and has not had an acute exacerbation since the last visit.  He feels that his breathing is at baseline.  The daughter is concerned because she hears him have chest congestion and a rattle at times.   Review of Systems  Constitutional: Negative for fever and unexpected weight change.  HENT: Positive for trouble swallowing. Negative for ear pain, nosebleeds, congestion, sore throat, rhinorrhea, sneezing, dental problem, postnasal drip and sinus pressure.   Eyes: Negative for redness and itching.  Respiratory: Positive for cough and shortness of breath. Negative for chest tightness and wheezing.   Cardiovascular: Positive for leg swelling. Negative for palpitations.  Gastrointestinal: Negative for nausea and vomiting.  Genitourinary: Negative for dysuria.  Musculoskeletal: Negative for joint swelling.  Skin: Negative for rash.  Neurological: Negative for headaches.  Hematological: Does not bruise/bleed easily.  Psychiatric/Behavioral: Negative for dysphoric mood. The patient is not nervous/anxious.        Objective:   Physical Exam Overweight male in no acute distress Nose without purulent discharge noted Chest with fairly good air flow, no wheezes or rhonchi Cardiac exam with irregularity, but controlled ventricular response Lower extremities with very little edema, no cyanosis Alert and oriented, moves all 4 extremities.       Assessment & Plan:

## 2011-07-29 NOTE — Assessment & Plan Note (Signed)
The patient appears fairly stable from a pulmonary standpoint, with no increased work of breathing at rest or bronchospasm on exam.  I have encouraged him to get back on Spiriva, in order to help his stated age breathing, prevent acute exacerbations, and to keep his lungs from adversely influencing his cardiac status.  The patient ultimately has the final say in all of this, and will consider my request.

## 2011-12-06 ENCOUNTER — Other Ambulatory Visit: Payer: Medicare Other

## 2011-12-14 ENCOUNTER — Ambulatory Visit
Admission: RE | Admit: 2011-12-14 | Discharge: 2011-12-14 | Disposition: A | Discharge status: Home / Self Care | Payer: Medicare Other | Source: Ambulatory Visit | Attending: Internal Medicine | Admitting: Internal Medicine

## 2011-12-14 DIAGNOSIS — K862 Cyst of pancreas: Secondary | ICD-10-CM

## 2011-12-14 HISTORY — DX: Cyst of pancreas: K86.2

## 2011-12-14 MED ORDER — GADOBENATE DIMEGLUMINE 529 MG/ML IV SOLN
20.0000 mL | Freq: Once | INTRAVENOUS | Status: AC | PRN
  Administered 2011-12-14: 20 mL via INTRAVENOUS

## 2012-01-27 ENCOUNTER — Ambulatory Visit: Payer: Medicare Other | Admitting: Pulmonary Disease

## 2012-01-28 ENCOUNTER — Ambulatory Visit: Payer: Medicare Other | Admitting: Pulmonary Disease

## 2012-05-31 ENCOUNTER — Emergency Department (HOSPITAL_BASED_OUTPATIENT_CLINIC_OR_DEPARTMENT_OTHER)
Admission: EM | Admit: 2012-05-31 | Discharge: 2012-05-31 | Disposition: A | Discharge status: Home / Self Care | Payer: Medicare Other | Attending: Emergency Medicine | Admitting: Emergency Medicine

## 2012-05-31 ENCOUNTER — Encounter (HOSPITAL_BASED_OUTPATIENT_CLINIC_OR_DEPARTMENT_OTHER): Payer: Self-pay | Admitting: *Deleted

## 2012-05-31 DIAGNOSIS — E039 Hypothyroidism, unspecified: Secondary | ICD-10-CM | POA: Insufficient documentation

## 2012-05-31 DIAGNOSIS — N401 Enlarged prostate with lower urinary tract symptoms: Secondary | ICD-10-CM | POA: Insufficient documentation

## 2012-05-31 DIAGNOSIS — Z87891 Personal history of nicotine dependence: Secondary | ICD-10-CM | POA: Insufficient documentation

## 2012-05-31 DIAGNOSIS — I10 Essential (primary) hypertension: Secondary | ICD-10-CM | POA: Insufficient documentation

## 2012-05-31 DIAGNOSIS — I509 Heart failure, unspecified: Secondary | ICD-10-CM | POA: Insufficient documentation

## 2012-05-31 DIAGNOSIS — R300 Dysuria: Secondary | ICD-10-CM | POA: Insufficient documentation

## 2012-05-31 DIAGNOSIS — N138 Other obstructive and reflux uropathy: Secondary | ICD-10-CM | POA: Insufficient documentation

## 2012-05-31 DIAGNOSIS — Z8669 Personal history of other diseases of the nervous system and sense organs: Secondary | ICD-10-CM | POA: Insufficient documentation

## 2012-05-31 DIAGNOSIS — Z85038 Personal history of other malignant neoplasm of large intestine: Secondary | ICD-10-CM | POA: Insufficient documentation

## 2012-05-31 DIAGNOSIS — Z7982 Long term (current) use of aspirin: Secondary | ICD-10-CM | POA: Insufficient documentation

## 2012-05-31 DIAGNOSIS — J4489 Other specified chronic obstructive pulmonary disease: Secondary | ICD-10-CM | POA: Insufficient documentation

## 2012-05-31 DIAGNOSIS — Z8679 Personal history of other diseases of the circulatory system: Secondary | ICD-10-CM | POA: Insufficient documentation

## 2012-05-31 DIAGNOSIS — I2510 Atherosclerotic heart disease of native coronary artery without angina pectoris: Secondary | ICD-10-CM | POA: Insufficient documentation

## 2012-05-31 DIAGNOSIS — N400 Enlarged prostate without lower urinary tract symptoms: Secondary | ICD-10-CM

## 2012-05-31 DIAGNOSIS — R350 Frequency of micturition: Secondary | ICD-10-CM | POA: Insufficient documentation

## 2012-05-31 DIAGNOSIS — E119 Type 2 diabetes mellitus without complications: Secondary | ICD-10-CM | POA: Insufficient documentation

## 2012-05-31 DIAGNOSIS — Z8673 Personal history of transient ischemic attack (TIA), and cerebral infarction without residual deficits: Secondary | ICD-10-CM | POA: Insufficient documentation

## 2012-05-31 DIAGNOSIS — Z79899 Other long term (current) drug therapy: Secondary | ICD-10-CM | POA: Insufficient documentation

## 2012-05-31 DIAGNOSIS — J449 Chronic obstructive pulmonary disease, unspecified: Secondary | ICD-10-CM | POA: Insufficient documentation

## 2012-05-31 DIAGNOSIS — Z8719 Personal history of other diseases of the digestive system: Secondary | ICD-10-CM | POA: Insufficient documentation

## 2012-05-31 DIAGNOSIS — I252 Old myocardial infarction: Secondary | ICD-10-CM | POA: Insufficient documentation

## 2012-05-31 DIAGNOSIS — E785 Hyperlipidemia, unspecified: Secondary | ICD-10-CM | POA: Insufficient documentation

## 2012-05-31 DIAGNOSIS — N390 Urinary tract infection, site not specified: Secondary | ICD-10-CM | POA: Insufficient documentation

## 2012-05-31 HISTORY — DX: Inflammatory disease of prostate, unspecified: N41.9

## 2012-05-31 HISTORY — DX: Tremor, unspecified: R25.1

## 2012-05-31 HISTORY — DX: Atherosclerotic heart disease of native coronary artery without angina pectoris: I25.10

## 2012-05-31 HISTORY — DX: Transient cerebral ischemic attack, unspecified: G45.9

## 2012-05-31 HISTORY — DX: Calculus of gallbladder without cholecystitis without obstruction: K80.20

## 2012-05-31 LAB — URINALYSIS, MICROSCOPIC ONLY
Glucose, UA: NEGATIVE mg/dL
Protein, ur: 100 mg/dL — AB
Specific Gravity, Urine: 1.02 (ref 1.005–1.030)
pH: 8.5 — ABNORMAL HIGH (ref 5.0–8.0)

## 2012-05-31 MED ORDER — CIPROFLOXACIN HCL 500 MG PO TABS: 500.0000 mg | ORAL_TABLET | Freq: Two times a day (BID) | ORAL | Status: DC

## 2012-05-31 MED ORDER — CIPROFLOXACIN HCL 500 MG PO TABS
500.0000 mg | ORAL_TABLET | Freq: Once | ORAL | Status: AC
  Administered 2012-05-31: 500 mg via ORAL
  Filled 2012-05-31: qty 1

## 2012-05-31 NOTE — ED Provider Notes (Signed)
History  This chart was scribed for Carleene Cooper III, MD by Ardeen Jourdain, ED Scribe. This patient was seen in room MH02/MH02 and the patient's care was started at 1747.  CSN: [id number]  Arrival date & time 05/31/12  1707   First MD Initiated Contact with Patient 05/31/12 1747      Chief Complaint  Patient presents with  . Urinary Retention     The history is provided by the patient. No language interpreter was used.   Nathaniel Lopez is a 78 y.o. male who presents to the Emergency Department complaining of urinary frequency with associated dysuria that started 3 days ago and has been gradually worsening since. He denies fever, chills, diarrhea and diaphoresis as associated symptoms. He states when he urinates only a small amount comes out. His daughter reports giving him 2 doses of Azo with no relief. His daughter also reports that he has had a h/o similar conditions. He has a h/o CHF, COPD, MI and TIA. He is a former smoker but denies alcohol use.   Past Medical History  Diagnosis Date  . CHF (congestive heart failure)     diastolic dysfunction  . Atrial fibrillation   . Colon cancer   . Carotid arterial disease   . COPD (chronic obstructive pulmonary disease)     moderate 2006 by PFT FEV1 1.39  . Hyperlipidemia   . Type 2 diabetes mellitus   . Hypertension   . Hypothyroid   . Pancreatic cyst   . Myocardial infarct   . Coronary artery disease   . Prostatitis   . Gallstones   . TIA (transient ischemic attack)   . Tremor     Past Surgical History  Procedure Date  . Hernia repair   . Cholecystectomy   . Coronary artery bypass graft   . Colon surgery   . Cataract extraction     Left    Family History  Problem Relation Age of Onset  . Heart attack Mother   . Breast cancer Mother   . Alzheimer's disease Sister     History  Substance Use Topics  . Smoking status: Former Smoker -- 2.0 packs/day for 20 years    Types: Cigarettes    Quit date: 07/01/1968  .  Smokeless tobacco: Never Used  . Alcohol Use: Not on file      Review of Systems  Genitourinary: Positive for dysuria and frequency.  All other systems reviewed and are negative.    Allergies  Peanut-containing drug products  Home Medications   Current Outpatient Rx  Name  Route  Sig  Dispense  Refill  . ASPIRIN 325 MG PO TABS   Oral   Take 325 mg by mouth daily.           Marland Kitchen DIGOXIN 0.125 MG PO TABS   Oral   Take 125 mcg by mouth daily.           Marland Kitchen DILTIAZEM HCL ER COATED BEADS 240 MG PO CP24   Oral   Take 240 mg by mouth daily.           Marland Kitchen DOCUSATE SODIUM 100 MG PO CAPS   Oral   Take 100 mg by mouth 2 (two) times daily.           . DONEPEZIL HCL 10 MG PO TABS   Oral   Take 10 mg by mouth at bedtime.           . FUROSEMIDE 20 MG PO TABS  Oral   Take 20 mg by mouth 2 (two) times daily.         Marland Kitchen LEVOTHYROXINE SODIUM 25 MCG PO TABS   Oral   Take 25 mcg by mouth daily.           Marland Kitchen LOSARTAN POTASSIUM 50 MG PO TABS   Oral   Take 50 mg by mouth daily.         Marland Kitchen METFORMIN HCL 1000 MG PO TABS   Oral   Take 1,000 mg by mouth 2 (two) times daily with a meal.           . MIRTAZAPINE 30 MG PO TABS   Oral   Take 30 mg by mouth at bedtime.           Marland Kitchen NIACIN ER (ANTIHYPERLIPIDEMIC) 1000 MG PO TBCR   Oral   Take 1,000 mg by mouth daily.           Marland Kitchen NITROGLYCERIN 0.4 MG SL SUBL   Sublingual   Place 0.4 mg under the tongue every 5 (five) minutes as needed.         Marland Kitchen SIMVASTATIN 40 MG PO TABS   Oral   Take 40 mg by mouth at bedtime.           . TAMSULOSIN HCL 0.4 MG PO CAPS   Oral   Take 0.4 mg by mouth daily.           Marland Kitchen VITAMIN B-12 1000 MCG PO TABS   Oral   Take 1,000 mcg by mouth 2 (two) times daily.             Triage Vitals: BP 147/68  Pulse 97  Temp 99.3 F (37.4 C) (Oral)  SpO2 95%  Physical Exam  Nursing note and vitals reviewed. Constitutional: He is oriented to person, place, and time. He appears  well-developed and well-nourished. No distress.  HENT:  Head: Normocephalic and atraumatic.       Deaf  Eyes: EOM are normal. Pupils are equal, round, and reactive to light.  Neck: Normal range of motion. Neck supple. No tracheal deviation present.  Cardiovascular: Normal rate, regular rhythm and normal heart sounds.   Pulmonary/Chest: Effort normal and breath sounds normal. No respiratory distress.  Abdominal: Soft. Bowel sounds are normal. He exhibits no distension. There is no tenderness.       Abdomen bloated so unable to tell if bladder is enlarged   Genitourinary: Prostate normal.       Prostate not enlarged  Musculoskeletal: Normal range of motion. He exhibits no edema.  Neurological: He is alert and oriented to person, place, and time.  Skin: Skin is warm and dry.       Well healed median sternotomy scar, well healed gastrointestinal scar, well healed mid-line abdominal scar, well healed cholecystectomy scar, skin on inner thigh area stained brown, possible evidence of incontinent stool   Psychiatric: He has a normal mood and affect. His behavior is normal.    ED Course  Procedures (including critical care time)  DIAGNOSTIC STUDIES: Oxygen Saturation is 95% on room air, adequate by my interpretation.    COORDINATION OF CARE:  5:48 PM: Discussed treatment plan which includes a urinalysis, foley catheter insertion and hemoccult test  with pt at bedside and pt agreed to plan.   Results for orders placed during the hospital encounter of 05/31/12  OCCULT BLOOD X 1 CARD TO LAB, STOOL      Component Value Range  Fecal Occult Bld NEGATIVE    URINALYSIS, MICROSCOPIC ONLY      Component Value Range   Color, Urine AMBER (*) YELLOW   APPearance CLOUDY (*) CLEAR   Specific Gravity, Urine 1.020  1.005 - 1.030   pH 8.5 (*) 5.0 - 8.0   Glucose, UA NEGATIVE  NEGATIVE mg/dL   Hgb urine dipstick LARGE (*) NEGATIVE   Bilirubin Urine SMALL (*) NEGATIVE   Ketones, ur NEGATIVE  NEGATIVE  mg/dL   Protein, ur 532 (*) NEGATIVE mg/dL   Urobilinogen, UA 1.0  0.0 - 1.0 mg/dL   Nitrite NEGATIVE  NEGATIVE   Leukocytes, UA LARGE (*) NEGATIVE   WBC, UA TOO NUMEROUS TO COUNT  <3 WBC/hpf   RBC / HPF 3-6  <3 RBC/hpf   Bacteria, UA MANY (*) RARE   Squamous Epithelial / LPF RARE  RARE   Feels better with catheter in place. Rx Cipro 500 mg bid x 7 days.  Family to call Dr. Margrett Rud office to make an appointment for catheter removal and trial of voiding.    1. Urinary tract infection   2. BPH (benign prostatic hyperplasia)     I personally performed the services described in this documentation, which was scribed in my presence. The recorded information has been reviewed and is accurate. Osvaldo Human, MD       Carleene Cooper III, MD 05/31/12 [phone]

## 2012-05-31 NOTE — ED Notes (Addendum)
C/o urinary frequency and dysuria x 3 days- pt on lasix- yesterday lasix was d/c- pt taking azo- pt now with decreased urine output-   Patient has hx of enlarged prostate, urine output was appropriate prior to d/c lasix

## 2012-06-03 LAB — URINE CULTURE: Colony Count: >=100000

## 2012-06-04 NOTE — ED Notes (Signed)
+   urine Patient treated with cipro-sensitive to same-chart appended per protocol MD. 

## 2012-07-22 ENCOUNTER — Other Ambulatory Visit: Payer: Self-pay

## 2012-07-22 ENCOUNTER — Emergency Department (HOSPITAL_BASED_OUTPATIENT_CLINIC_OR_DEPARTMENT_OTHER)
Admission: EM | Admit: 2012-07-22 | Discharge: 2012-07-22 | Disposition: A | Discharge status: Home / Self Care | Payer: Medicare Other | Attending: Emergency Medicine | Admitting: Emergency Medicine

## 2012-07-22 ENCOUNTER — Encounter (HOSPITAL_BASED_OUTPATIENT_CLINIC_OR_DEPARTMENT_OTHER): Payer: Self-pay | Admitting: *Deleted

## 2012-07-22 ENCOUNTER — Emergency Department (HOSPITAL_BASED_OUTPATIENT_CLINIC_OR_DEPARTMENT_OTHER): Payer: Medicare Other

## 2012-07-22 DIAGNOSIS — R059 Cough, unspecified: Secondary | ICD-10-CM | POA: Insufficient documentation

## 2012-07-22 DIAGNOSIS — E039 Hypothyroidism, unspecified: Secondary | ICD-10-CM | POA: Insufficient documentation

## 2012-07-22 DIAGNOSIS — Z87448 Personal history of other diseases of urinary system: Secondary | ICD-10-CM | POA: Insufficient documentation

## 2012-07-22 DIAGNOSIS — J441 Chronic obstructive pulmonary disease with (acute) exacerbation: Secondary | ICD-10-CM | POA: Insufficient documentation

## 2012-07-22 DIAGNOSIS — Z85038 Personal history of other malignant neoplasm of large intestine: Secondary | ICD-10-CM | POA: Insufficient documentation

## 2012-07-22 DIAGNOSIS — E785 Hyperlipidemia, unspecified: Secondary | ICD-10-CM | POA: Insufficient documentation

## 2012-07-22 DIAGNOSIS — Z79899 Other long term (current) drug therapy: Secondary | ICD-10-CM | POA: Insufficient documentation

## 2012-07-22 DIAGNOSIS — J449 Chronic obstructive pulmonary disease, unspecified: Secondary | ICD-10-CM

## 2012-07-22 DIAGNOSIS — I509 Heart failure, unspecified: Secondary | ICD-10-CM | POA: Insufficient documentation

## 2012-07-22 DIAGNOSIS — E119 Type 2 diabetes mellitus without complications: Secondary | ICD-10-CM | POA: Insufficient documentation

## 2012-07-22 DIAGNOSIS — I2510 Atherosclerotic heart disease of native coronary artery without angina pectoris: Secondary | ICD-10-CM | POA: Insufficient documentation

## 2012-07-22 DIAGNOSIS — I10 Essential (primary) hypertension: Secondary | ICD-10-CM | POA: Insufficient documentation

## 2012-07-22 DIAGNOSIS — Z7982 Long term (current) use of aspirin: Secondary | ICD-10-CM | POA: Insufficient documentation

## 2012-07-22 DIAGNOSIS — R443 Hallucinations, unspecified: Secondary | ICD-10-CM | POA: Insufficient documentation

## 2012-07-22 DIAGNOSIS — R509 Fever, unspecified: Secondary | ICD-10-CM | POA: Insufficient documentation

## 2012-07-22 DIAGNOSIS — Z8719 Personal history of other diseases of the digestive system: Secondary | ICD-10-CM | POA: Insufficient documentation

## 2012-07-22 DIAGNOSIS — J45909 Unspecified asthma, uncomplicated: Secondary | ICD-10-CM | POA: Insufficient documentation

## 2012-07-22 DIAGNOSIS — I252 Old myocardial infarction: Secondary | ICD-10-CM | POA: Insufficient documentation

## 2012-07-22 DIAGNOSIS — R05 Cough: Secondary | ICD-10-CM | POA: Insufficient documentation

## 2012-07-22 DIAGNOSIS — IMO0002 Reserved for concepts with insufficient information to code with codable children: Secondary | ICD-10-CM | POA: Insufficient documentation

## 2012-07-22 DIAGNOSIS — Z8673 Personal history of transient ischemic attack (TIA), and cerebral infarction without residual deficits: Secondary | ICD-10-CM | POA: Insufficient documentation

## 2012-07-22 DIAGNOSIS — Z87891 Personal history of nicotine dependence: Secondary | ICD-10-CM | POA: Insufficient documentation

## 2012-07-22 LAB — URINALYSIS, ROUTINE W REFLEX MICROSCOPIC
Glucose, UA: NEGATIVE mg/dL
Hgb urine dipstick: NEGATIVE
Ketones, ur: NEGATIVE mg/dL
Protein, ur: NEGATIVE mg/dL
pH: 5.5 (ref 5.0–8.0)

## 2012-07-22 LAB — CBC WITH DIFFERENTIAL/PLATELET
Basophils Absolute: 0 10*3/uL (ref 0.0–0.1)
Basophils Relative: 0 % (ref 0–1)
Hemoglobin: 12.4 g/dL — ABNORMAL LOW (ref 13.0–17.0)
MCHC: 33.7 g/dL (ref 30.0–36.0)
Neutro Abs: 2.9 10*3/uL (ref 1.7–7.7)
Neutrophils Relative %: 60 % (ref 43–77)
Platelets: 246 10*3/uL (ref 150–400)
RDW: 14.4 % (ref 11.5–15.5)

## 2012-07-22 LAB — PROTIME-INR: Prothrombin Time: 12.9 seconds (ref 11.6–15.2)

## 2012-07-22 LAB — COMPREHENSIVE METABOLIC PANEL
AST: 16 U/L (ref 0–37)
Albumin: 3.3 g/dL — ABNORMAL LOW (ref 3.5–5.2)
Alkaline Phosphatase: 101 U/L (ref 39–117)
Chloride: 99 mEq/L (ref 96–112)
Potassium: 3.8 mEq/L (ref 3.5–5.1)
Sodium: 136 mEq/L (ref 135–145)
Total Bilirubin: 0.2 mg/dL — ABNORMAL LOW (ref 0.3–1.2)
Total Protein: 6.8 g/dL (ref 6.0–8.3)

## 2012-07-22 LAB — APTT: aPTT: 41 seconds — ABNORMAL HIGH (ref 24–37)

## 2012-07-22 MED ORDER — ALBUTEROL SULFATE (5 MG/ML) 0.5% IN NEBU
5.0000 mg | INHALATION_SOLUTION | Freq: Once | RESPIRATORY_TRACT | Status: AC
  Administered 2012-07-22: 5 mg via RESPIRATORY_TRACT
  Filled 2012-07-22: qty 1

## 2012-07-22 MED ORDER — AZITHROMYCIN 250 MG PO TABS: ORAL_TABLET | ORAL | Status: DC

## 2012-07-22 MED ORDER — ALBUTEROL SULFATE HFA 108 (90 BASE) MCG/ACT IN AERS: 2.0000 | INHALATION_SPRAY | RESPIRATORY_TRACT | Status: DC | PRN

## 2012-07-22 MED ORDER — SODIUM CHLORIDE 0.9 % IV SOLN
Freq: Once | INTRAVENOUS | Status: AC
  Administered 2012-07-22: 10:00:00 via INTRAVENOUS

## 2012-07-22 NOTE — ED Notes (Signed)
Cold symptoms started on Friday at his house fell from recliner no injuries on Sunday went to family house on Sunday started on mucinex ibuprofen for temp feeling worse wanted to stay in bed all day yesterday and this morning was yelling out from his bedroom having hallucinations  Wheezing noted on pt arrival

## 2012-07-22 NOTE — ED Notes (Signed)
Pt arrived with no catheter in place as is documented in epic chart per his family members he had an indwelling catheter for over a month and then had it removed.

## 2012-07-22 NOTE — ED Provider Notes (Signed)
History     CSN: [id number]  Arrival date & time 07/22/12  8295   First MD Initiated Contact with Patient 07/22/12 [phone]      Chief Complaint  Patient presents with  . Shortness of Breath    (Consider location/radiation/quality/duration/timing/severity/associated sxs/prior treatment) Patient is a 79 y.o. male presenting with cough. The history is provided by a relative and medical records (The patient's daughter gives his history.). No language interpreter was used.  Cough Chronicity: The patient's daughter says that his symptoms started about 5 days ago. He had runny nose and cough at that time. Episode onset: Over the intervening days he's developed wheezing. He has taken Tylenol and guaifenesin for symptomatic relief. Yesterday he was worse, staying in bed. The problem has been gradually worsening (Today he was calling out, had told his daughter that he was seeing things and hearing voices.). The cough is non-productive. The maximum temperature recorded prior to his arrival was 100 to 100.9 F. The fever has been present for 3 to 4 days. Associated symptoms include chills, shortness of breath and wheezing. He has tried decongestants for the symptoms. The treatment provided no relief. He is not a smoker. His past medical history is significant for COPD.    Past Medical History  Diagnosis Date  . CHF (congestive heart failure)     diastolic dysfunction  . Atrial fibrillation   . Colon cancer   . Carotid arterial disease   . COPD (chronic obstructive pulmonary disease)     moderate 2006 by PFT FEV1 1.39  . Hyperlipidemia   . Type 2 diabetes mellitus   . Hypertension   . Hypothyroid   . Pancreatic cyst   . Myocardial infarct   . Coronary artery disease   . Prostatitis   . Gallstones   . TIA (transient ischemic attack)   . Tremor     Past Surgical History  Procedure Date  . Hernia repair   . Cholecystectomy   . Coronary artery bypass graft   . Colon surgery   . Cataract  extraction     Left    Family History  Problem Relation Age of Onset  . Heart attack Mother   . Breast cancer Mother   . Alzheimer's disease Sister     History  Substance Use Topics  . Smoking status: Former Smoker -- 2.0 packs/day for 20 years    Types: Cigarettes    Quit date: 07/01/1968  . Smokeless tobacco: Never Used  . Alcohol Use: Not on file      Review of Systems  Constitutional: Positive for fever and chills.  HENT: Negative.   Eyes: Negative.   Respiratory: Positive for cough, shortness of breath and wheezing.   Cardiovascular: Negative.   Gastrointestinal: Negative.   Musculoskeletal: Negative.   Skin: Negative.   Neurological: Negative.   Psychiatric/Behavioral: Positive for hallucinations and agitation.    Allergies  Peanut-containing drug products  Home Medications   Current Outpatient Rx  Name  Route  Sig  Dispense  Refill  . ASPIRIN 325 MG PO TABS   Oral   Take 325 mg by mouth daily.           Marland Kitchen CIPROFLOXACIN HCL 500 MG PO TABS   Oral   Take 1 tablet (500 mg total) by mouth 2 (two) times daily.   14 tablet   0   . DIGOXIN 0.125 MG PO TABS   Oral   Take 125 mcg by mouth daily.           Marland Kitchen  DILTIAZEM HCL ER COATED BEADS 240 MG PO CP24   Oral   Take 240 mg by mouth daily.           Marland Kitchen DOCUSATE SODIUM 100 MG PO CAPS   Oral   Take 100 mg by mouth 2 (two) times daily.           . DONEPEZIL HCL 10 MG PO TABS   Oral   Take 10 mg by mouth at bedtime.           . FUROSEMIDE 20 MG PO TABS   Oral   Take 20 mg by mouth 2 (two) times daily.         Marland Kitchen LEVOTHYROXINE SODIUM 25 MCG PO TABS   Oral   Take 25 mcg by mouth daily.           Marland Kitchen LOSARTAN POTASSIUM 50 MG PO TABS   Oral   Take 50 mg by mouth daily.         Marland Kitchen METFORMIN HCL 1000 MG PO TABS   Oral   Take 1,000 mg by mouth 2 (two) times daily with a meal.           . METOPROLOL SUCCINATE ER 25 MG PO TB24   Oral   Take 25 mg by mouth daily.         Marland Kitchen  MIRTAZAPINE 30 MG PO TABS   Oral   Take 30 mg by mouth at bedtime.           Marland Kitchen NIACIN ER (ANTIHYPERLIPIDEMIC) 1000 MG PO TBCR   Oral   Take 1,000 mg by mouth daily.           Marland Kitchen NITROGLYCERIN 0.4 MG SL SUBL   Sublingual   Place 0.4 mg under the tongue every 5 (five) minutes as needed.         Marland Kitchen SIMVASTATIN 40 MG PO TABS   Oral   Take 40 mg by mouth at bedtime.           . TAMSULOSIN HCL 0.4 MG PO CAPS   Oral   Take 0.4 mg by mouth daily.           Marland Kitchen VITAMIN B-12 1000 MCG PO TABS   Oral   Take 1,000 mcg by mouth 2 (two) times daily.             BP 112/50  Pulse 83  Temp 97.9 F (36.6 C)  Resp 22  SpO2 93%  Physical Exam  Nursing note and vitals reviewed. Constitutional: He is oriented to person, place, and time. He appears well-developed and well-nourished.       Has audible wheezes.  HENT:  Head: Normocephalic and atraumatic.  Right Ear: External ear normal.  Left Ear: External ear normal.  Mouth/Throat: Oropharynx is clear and moist.  Eyes: Conjunctivae normal and EOM are normal. Pupils are equal, round, and reactive to light.  Neck: Normal range of motion. Neck supple. No JVD present.  Cardiovascular: Normal rate.        Mildly irregular rhythm.  Pulmonary/Chest: Effort normal. He has wheezes.  Abdominal: Soft. Bowel sounds are normal.  Musculoskeletal: Normal range of motion. He exhibits no edema and no tenderness.  Neurological: He is alert and oriented to person, place, and time.       No sensory or motor deficit.  Skin: Skin is warm and dry.  Psychiatric: He has a normal mood and affect. His behavior is normal.  ED Course  Procedures (including critical care time)  8:59 AM  Date: 07/22/2012  Rate:78  Rhythm: atrial fibrillation  QRS Axis: normal  Intervals: normal QRS:  Poor R wave progression in precordial leads suggests possible old anterior myocardial infarction.  ST/T Wave abnormalities: normal  Conduction Disutrbances:none   Narrative Interpretation: Abnormal EKG  Old EKG Reviewed: unchanged   9:25 AM Patient was seen and had physical examination. Old charts were reviewed. He has a history of atrial fibrillation and of COPD. Albuterol nebulizer treatment was ordered.  Lab workup was ordered.  10:39 AM Results for orders placed during the hospital encounter of 07/22/12  CBC WITH DIFFERENTIAL      Component Value Range   WBC 4.8  4.0 - 10.5 K/uL   RBC 4.00 (*) 4.22 - 5.81 MIL/uL   Hemoglobin 12.4 (*) 13.0 - 17.0 g/dL   HCT 63.8 (*) 75.6 - 43.3 %   MCV 92.0  78.0 - 100.0 fL   MCH 31.0  26.0 - 34.0 pg   MCHC 33.7  30.0 - 36.0 g/dL   RDW 29.5  18.8 - 41.6 %   Platelets 246  150 - 400 K/uL   Neutrophils Relative 60  43 - 77 %   Neutro Abs 2.9  1.7 - 7.7 K/uL   Lymphocytes Relative 24  12 - 46 %   Lymphs Abs 1.1  0.7 - 4.0 K/uL   Monocytes Relative 13 (*) 3 - 12 %   Monocytes Absolute 0.6  0.1 - 1.0 K/uL   Eosinophils Relative 3  0 - 5 %   Eosinophils Absolute 0.1  0.0 - 0.7 K/uL   Basophils Relative 0  0 - 1 %   Basophils Absolute 0.0  0.0 - 0.1 K/uL  COMPREHENSIVE METABOLIC PANEL      Component Value Range   Sodium 136  135 - 145 mEq/L   Potassium 3.8  3.5 - 5.1 mEq/L   Chloride 99  96 - 112 mEq/L   CO2 27  19 - 32 mEq/L   Glucose, Bld 133 (*) 70 - 99 mg/dL   BUN 19  6 - 23 mg/dL   Creatinine, Ser 6.06  0.50 - 1.35 mg/dL   Calcium 9.1  8.4 - 30.1 mg/dL   Total Protein 6.8  6.0 - 8.3 g/dL   Albumin 3.3 (*) 3.5 - 5.2 g/dL   AST 16  0 - 37 U/L   ALT 26  0 - 53 U/L   Alkaline Phosphatase 101  39 - 117 U/L   Total Bilirubin 0.2 (*) 0.3 - 1.2 mg/dL   GFR calc non Af Amer 77 (*) >90 mL/min   GFR calc Af Amer 89 (*) >90 mL/min  URINALYSIS, ROUTINE W REFLEX MICROSCOPIC      Component Value Range   Color, Urine YELLOW  YELLOW   APPearance CLEAR  CLEAR   Specific Gravity, Urine 1.019  1.005 - 1.030   pH 5.5  5.0 - 8.0   Glucose, UA NEGATIVE  NEGATIVE mg/dL   Hgb urine dipstick NEGATIVE  NEGATIVE    Bilirubin Urine NEGATIVE  NEGATIVE   Ketones, ur NEGATIVE  NEGATIVE mg/dL   Protein, ur NEGATIVE  NEGATIVE mg/dL   Urobilinogen, UA 0.2  0.0 - 1.0 mg/dL   Nitrite NEGATIVE  NEGATIVE   Leukocytes, UA NEGATIVE  NEGATIVE  PROTIME-INR      Component Value Range   Prothrombin Time 12.9  11.6 - 15.2 seconds   INR 0.98  0.00 -  1.49  APTT      Component Value Range   aPTT 41 (*) 24 - 37 seconds   Dg Chest 2 View  07/22/2012  *RADIOLOGY REPORT*  Clinical Data: Shortness of breath, cough, wheezing, COPD  CHEST - 2 VIEW  Comparison: 05/12/2010  Findings: Cardiomediastinal silhouette is stable.  Status post CABG again noted.  No focal infiltrate or pulmonary edema.  There is slight worsening interstitial prominence bilaterally probable chronic in nature.  Mild degenerative changes thoracic spine.  IMPRESSION: Slight worsening interstitial prominence bilaterally probable chronic in nature without convincing pulmonary edema.  No focal infiltrate.  Status post CABG.   Original Report Authenticated By: Natasha Mead, M.D.     Lab workup showed no pneumonia.  Will treat for asthmatic bronchitis and COPD with albuterol inhaler and a Z-Pak.  1. Asthmatic bronchitis   2. COPD (chronic obstructive pulmonary disease)         Carleene Cooper III, MD 07/22/12 1044

## 2012-07-22 NOTE — ED Notes (Signed)
MD at bedside. 

## 2012-08-15 ENCOUNTER — Encounter: Payer: Self-pay | Admitting: Pharmacist Clinician (PhC)/ Clinical Pharmacy Specialist

## 2012-09-16 ENCOUNTER — Other Ambulatory Visit: Payer: Self-pay | Admitting: *Deleted

## 2012-09-16 MED ORDER — METFORMIN HCL 1000 MG PO TABS: ORAL_TABLET | ORAL | Status: DC

## 2012-11-06 ENCOUNTER — Other Ambulatory Visit: Payer: Medicare Other

## 2012-11-10 ENCOUNTER — Encounter: Payer: Self-pay | Admitting: Geriatric Medicine

## 2012-11-10 ENCOUNTER — Ambulatory Visit: Payer: Self-pay | Admitting: Internal Medicine

## 2012-11-24 ENCOUNTER — Encounter: Payer: Self-pay | Admitting: Geriatric Medicine

## 2012-11-25 ENCOUNTER — Encounter: Payer: Self-pay | Admitting: Internal Medicine

## 2012-11-25 ENCOUNTER — Other Ambulatory Visit: Payer: Self-pay | Admitting: *Deleted

## 2012-11-25 ENCOUNTER — Ambulatory Visit (INDEPENDENT_AMBULATORY_CARE_PROVIDER_SITE_OTHER): Payer: Medicare Other | Admitting: Internal Medicine

## 2012-11-25 VITALS — BP 134/72 | HR 64 | Temp 98.3°F | Resp 14 | Ht 69.5 in | Wt 205.8 lb

## 2012-11-25 DIAGNOSIS — E119 Type 2 diabetes mellitus without complications: Secondary | ICD-10-CM

## 2012-11-25 DIAGNOSIS — I4891 Unspecified atrial fibrillation: Secondary | ICD-10-CM

## 2012-11-25 DIAGNOSIS — E039 Hypothyroidism, unspecified: Secondary | ICD-10-CM

## 2012-11-25 DIAGNOSIS — G309 Alzheimer's disease, unspecified: Secondary | ICD-10-CM

## 2012-11-25 DIAGNOSIS — I2510 Atherosclerotic heart disease of native coronary artery without angina pectoris: Secondary | ICD-10-CM

## 2012-11-25 DIAGNOSIS — I10 Essential (primary) hypertension: Secondary | ICD-10-CM

## 2012-11-25 DIAGNOSIS — E785 Hyperlipidemia, unspecified: Secondary | ICD-10-CM

## 2012-11-25 MED ORDER — MEMANTINE HCL ER 28 MG PO CP24: 1.0000 | ORAL_CAPSULE | Freq: Every day | ORAL | Status: DC

## 2012-11-25 NOTE — Progress Notes (Signed)
Subjective:    Patient ID: Nathaniel Lopez, male    DOB: 1928-02-20, 79 y.o.   MRN: [id number]  Chief Complaint  Patient presents with  . Medical Managment of Chronic Issues   Allergies  Allergen Reactions  . Ace Inhibitors Cough  . Peanut-Containing Drug Products     REACTION: cough    HPI  79 y/o male patient hard of hearing is here with his daughter in law. He was seeing Dr Leanord Hawking prior to this visit. He has hx of htn, dm, CAD, CHF among others.  HTN-  sbp 139-175 (mostly less than 145) and DBP 66-87 with home readings reviewed. No headaches, chest pain. Complaint with his medications  AFIB- on digoxin, diltiazem and toprol, rate controlled. Denies chest pain or palpitations. Follow with [facility] croitoru. On asa for anticoagulation  DEMENTIA- on donepezil and tolerating it well. Daughter inlaw has noticed further decline in his memory recently. On remeron to help with his mood   HYPOTHYROIDISM- no recent tsh. Taking levothyroxine  DM TYPE 2 cbg 136-166.on metformin 1000 mg bid. No recent a1c  URINARY RETENTION- on finasteride and tamsulosin  CAD- Remains chest pain free. Off statin currently (daughter in law mentions pt taken off statin by cardiology- no documentation noted). She insists he needs to be on it. On prn ntg and has not required it recently  chf- on lasix 40 mg daily and 20 mg daily additional as needed. On b blcoker, acei  His daughter in law requests for form to be filled out for disability parking placard He gets melatonin 5 mg daily and sometimes 10 mg daily to help with involuntary jerks and tremors. Daughter in law mentions him having ? TIA in past  Review of Systems  Constitutional: Positive for fatigue. Negative for fever, chills, diaphoresis, activity change and appetite change.  HENT: Positive for hearing loss. Negative for ear pain, congestion, facial swelling, mouth sores and tinnitus.   Eyes: Negative for visual  disturbance.  Respiratory: Positive for shortness of breath. Negative for cough and chest tightness.   Cardiovascular: Negative for chest pain, palpitations and leg swelling.  Gastrointestinal: Negative for abdominal pain and constipation.  Genitourinary: Negative for dysuria.  Musculoskeletal: Negative for joint swelling.  Skin: Negative for rash.  Neurological: Negative for dizziness, tremors and light-headedness.  Hematological: Negative for adenopathy.  Psychiatric/Behavioral: Positive for confusion. Negative for behavioral problems and agitation.      Objective:   Physical Exam  Constitutional: He appears well-developed and well-nourished. No distress.  HENT:  Head: Normocephalic and atraumatic.  Mouth/Throat: Oropharynx is clear and moist.  Eyes: Conjunctivae are normal. Pupils are equal, round, and reactive to light.  Neck: Normal range of motion. Neck supple. No JVD present. No tracheal deviation present. No thyromegaly present.  Cardiovascular: Normal rate, regular rhythm and normal heart sounds.   Pulmonary/Chest: Effort normal and breath sounds normal. No respiratory distress. He has no wheezes.  Sternotomy scar present  Abdominal: Soft. Bowel sounds are normal. There is no tenderness.  Musculoskeletal: Normal range of motion. He exhibits no edema.  Uses a cane and has occassional involuntary jerks  Neurological: He is alert. No cranial nerve deficit.  Oriented to person and place  Skin: Skin is warm and dry.  Psychiatric: He has a normal mood and affect.    BP 134/72  Pulse 64  Temp(Src) 98.3 F (36.8 C) (Oral)  Resp 14  Ht 5' 9.5" (1.765 m)  Wt 205 lb 12.8 oz (  93.35 kg)  BMI 29.97 kg/m2     Assessment & Plan:   htn- bp normal in offcie today. Few elevated home bp readings. Asked daughter in law to bring his home bp machine on next visit. Will not make bp med chnages at present. Check bmp. Warning signs with elevated bp explained  CAD- remains chest pain  free. Continue current b blcoker, ACEI and ASA. Will check flp and restart statin for mortality benefit  CHF- stable, will not make any changes. Check cmp  Hypothyroidism- appears to be tolerating levothyroxine well.c ontinue this and check tsh  DM type 2- check a1c. Continue metformin for now with asa and acei. Check flp  afib- rate controlled with current av nodal blocking agents, check digoxin level  Dementia- likely a mixture of alzhimer's and vascular. Continue donepezil will introduce namenda titration pack followed by 28 mg daily after a month. Continue remeron to help with mood. Course of illness, prognosis explained to both pt and family member  Form filled out for handicap placard   Labs- cbc, cmp, a1c, flp, tsh, digoxin level

## 2012-11-26 ENCOUNTER — Other Ambulatory Visit: Payer: Medicare Other

## 2012-11-26 ENCOUNTER — Other Ambulatory Visit: Payer: Self-pay | Admitting: *Deleted

## 2012-11-26 DIAGNOSIS — I10 Essential (primary) hypertension: Secondary | ICD-10-CM

## 2012-11-26 DIAGNOSIS — E119 Type 2 diabetes mellitus without complications: Secondary | ICD-10-CM

## 2012-11-26 DIAGNOSIS — G309 Alzheimer's disease, unspecified: Secondary | ICD-10-CM

## 2012-11-26 DIAGNOSIS — I4891 Unspecified atrial fibrillation: Secondary | ICD-10-CM

## 2012-11-26 DIAGNOSIS — F0280 Dementia in other diseases classified elsewhere without behavioral disturbance: Secondary | ICD-10-CM

## 2012-11-26 DIAGNOSIS — E785 Hyperlipidemia, unspecified: Secondary | ICD-10-CM

## 2012-11-26 DIAGNOSIS — E039 Hypothyroidism, unspecified: Secondary | ICD-10-CM

## 2012-11-26 MED ORDER — GLUCOSE BLOOD VI STRP: ORAL_STRIP | Status: DC

## 2012-11-27 ENCOUNTER — Other Ambulatory Visit: Payer: Self-pay | Admitting: Geriatric Medicine

## 2012-11-27 LAB — CBC WITH DIFFERENTIAL/PLATELET
Basophils Absolute: 0 10*3/uL (ref 0.0–0.2)
Basos: 1 % (ref 0–3)
Eosinophils Absolute: 0.2 10*3/uL (ref 0.0–0.4)
Immature Grans (Abs): 0 10*3/uL (ref 0.0–0.1)
Immature Granulocytes: 0 % (ref 0–2)
Lymphs: 32 % (ref 14–46)
MCH: 29.7 pg (ref 26.6–33.0)
MCHC: 32.8 g/dL (ref 31.5–35.7)
MCV: 91 fL (ref 79–97)
Monocytes Absolute: 0.6 10*3/uL (ref 0.1–0.9)
Neutrophils Relative %: 55 % (ref 40–74)
RBC: 4.54 x10E6/uL (ref 4.14–5.80)
RDW: 16.2 % — ABNORMAL HIGH (ref 12.3–15.4)

## 2012-11-27 LAB — LIPID PANEL
Cholesterol, Total: 215 mg/dL — ABNORMAL HIGH (ref 100–199)
HDL: 24 mg/dL — ABNORMAL LOW (ref >39)
LDL Calculated: 140 mg/dL — ABNORMAL HIGH (ref 0–99)
VLDL Cholesterol Cal: 51 mg/dL — ABNORMAL HIGH (ref 5–40)

## 2012-11-27 LAB — COMPREHENSIVE METABOLIC PANEL
ALT: 27 IU/L (ref 0–44)
Albumin: 4.3 g/dL (ref 3.5–4.7)
BUN: 19 mg/dL (ref 8–27)
Calcium: 9.4 mg/dL (ref 8.6–10.2)
Chloride: 101 mmol/L (ref 97–108)
GFR calc Af Amer: 71 mL/min/{1.73_m2} (ref >59)
GFR calc non Af Amer: 61 mL/min/{1.73_m2} (ref >59)
Glucose: 120 mg/dL — ABNORMAL HIGH (ref 65–99)
Potassium: 4.2 mmol/L (ref 3.5–5.2)
Total Bilirubin: 0.3 mg/dL (ref 0.0–1.2)
Total Protein: 6.7 g/dL (ref 6.0–8.5)

## 2012-11-27 LAB — HEMOGLOBIN A1C: Est. average glucose Bld gHb Est-mCnc: 151 mg/dL

## 2012-11-27 LAB — TSH: TSH: 6.55 u[IU]/mL — ABNORMAL HIGH (ref 0.450–4.500)

## 2012-11-27 MED ORDER — LEVOTHYROXINE SODIUM 50 MCG PO TABS: ORAL_TABLET | ORAL | Status: DC

## 2012-12-08 ENCOUNTER — Telehealth: Payer: Self-pay | Admitting: *Deleted

## 2012-12-08 NOTE — Telephone Encounter (Signed)
error 

## 2012-12-30 ENCOUNTER — Other Ambulatory Visit: Payer: Self-pay | Admitting: *Deleted

## 2012-12-30 MED ORDER — METFORMIN HCL 1000 MG PO TABS: ORAL_TABLET | ORAL | Status: DC

## 2013-01-15 ENCOUNTER — Ambulatory Visit (INDEPENDENT_AMBULATORY_CARE_PROVIDER_SITE_OTHER): Payer: Medicare Other | Admitting: Cardiovascular Disease

## 2013-01-15 ENCOUNTER — Encounter: Payer: Self-pay | Admitting: Cardiovascular Disease

## 2013-01-15 VITALS — BP 144/62 | HR 84 | Resp 16 | Ht 69.0 in | Wt 204.1 lb

## 2013-01-15 DIAGNOSIS — I4891 Unspecified atrial fibrillation: Secondary | ICD-10-CM

## 2013-01-15 DIAGNOSIS — E039 Hypothyroidism, unspecified: Secondary | ICD-10-CM

## 2013-01-15 DIAGNOSIS — E785 Hyperlipidemia, unspecified: Secondary | ICD-10-CM

## 2013-01-15 DIAGNOSIS — I10 Essential (primary) hypertension: Secondary | ICD-10-CM

## 2013-01-15 DIAGNOSIS — R0602 Shortness of breath: Secondary | ICD-10-CM

## 2013-01-15 DIAGNOSIS — I2510 Atherosclerotic heart disease of native coronary artery without angina pectoris: Secondary | ICD-10-CM

## 2013-01-15 DIAGNOSIS — I509 Heart failure, unspecified: Secondary | ICD-10-CM

## 2013-01-15 MED ORDER — FUROSEMIDE 20 MG PO TABS: ORAL_TABLET | ORAL | Status: DC

## 2013-01-15 NOTE — Patient Instructions (Addendum)
Your physician recommends that you weigh, daily, at the same time every day, and in the same amount of clothing. Please record your daily weights on the handout provided and bring it to your next appointment.  Take furosemide 20 mg twice a day if your weight is less than 197 pounds. Take furosemide 20 mg, 2 tablets in the morning and one tablet in the afternoon, if your weight is 197 pounds or greater Stop simvastatin. Start pravastatin 40 mg daily at bedtime  Your physician recommends that you return for lab work today.  Your physician recommends that you schedule a follow-up appointment in: 3 months  Your physician has requested that you have an echocardiogram. Echocardiography is a painless test that uses sound waves to create images of your heart. It provides your doctor with information about the size and shape of your heart and how well your heart's chambers and valves are working. This procedure takes approximately one hour. There are no restrictions for this procedure.

## 2013-01-16 ENCOUNTER — Encounter: Payer: Self-pay | Admitting: Cardiovascular Disease

## 2013-01-16 DIAGNOSIS — I2510 Atherosclerotic heart disease of native coronary artery without angina pectoris: Secondary | ICD-10-CM | POA: Insufficient documentation

## 2013-01-16 LAB — TSH: TSH: 3.47 u[IU]/mL (ref 0.350–4.500)

## 2013-01-16 MED ORDER — PRAVASTATIN SODIUM 40 MG PO TABS: 40.0000 mg | ORAL_TABLET | Freq: Every evening | ORAL | Status: DC

## 2013-01-16 NOTE — Assessment & Plan Note (Signed)
Fair control. His blood pressure today is slightly high but most of the readings at home are within the desirable range; no adjustments are made to the medications

## 2013-01-16 NOTE — Assessment & Plan Note (Addendum)
As of the potential adverse interaction of diltiazem and simvastatin will switch him today to pravastatin 40 mg at bedtime daily . I am not certain but I believe the most recent lipid profile may be performed when he was off statin therapy. One year ago while taking the same dose of simvastatin his total cholesterol is 150, HDL 27 nitroglycerin 146, With LDL 94. It is also possible that his mild hypothyroidism may have contributed to some degree to the higher than usual cholesterol levels. I will repeat his lipid profile in another few months now that he is switching to a different statin.

## 2013-01-16 NOTE — Assessment & Plan Note (Signed)
He has permanent atrial fibrillation. I believe his rate control is adequate but his daughter believes that his ventricular rates have been faster than usual recently. It is possible that he has some mild hypervolemia although he does not appear to be in overt heart failure. I would not change his rate control medications. I would not increase his dose of digoxin simply because his "level is too low". Note that he has preserved left ventricle systolic function and normal renal function. Dose of thyroid medications as recently adjusted and we will check a TSH to make sure that he is not iatrogenically hyperthyroid. He he has had previous falls and is at risk for further injury. He is only on aspirin for stroke prophylaxis for this reason.

## 2013-01-16 NOTE — Assessment & Plan Note (Signed)
Carotid ultrasound performed within the last couple of years showed minor plaque without significant stenoses

## 2013-01-16 NOTE — Assessment & Plan Note (Signed)
He has no symptoms of coronary insufficiency. No coronary events have occurred since his bypass procedure in 2006.

## 2013-01-16 NOTE — Assessment & Plan Note (Addendum)
By clinical criteria he appears to be euvolemic, but his daughter has noticed that he is more dyspneic and his weight is indeed roughly 4 pounds above what it was in December. I think it is reasonable that he receive a "sliding scale" prescription for furosemide. His home scale shows roughly 3 pounds less in our office scale. He can take to twice the usual morning dose of torsemide if his weights at home exceed 197 pounds. This should be accompanied by an increased intake of potassium from food. His daughter thinks this is preferable to administering a large potassium supplements tablet. Also check an echocardiogram to make sure that the changes are not related to worsening left ventricular function

## 2013-01-16 NOTE — Progress Notes (Signed)
Patient ID: Nathaniel Lopez, male   DOB: 1927/12/22, 79 y.o.   MRN: [id number]     Reason for office visit Followup coronary artery disease, atrial fibrillation, congestive heart failure  There have been no major changes in Mrs. Holshouser's health since I last saw him in December but his daughter is concerned about some changes in his vital signs. She has kept a detailed record of his vital signs and notes that his heart rate is faster than usual. Usually his heart rate was in the 60-70 beat per minute range now it is often in the high 80s and 90s. She brings a detailed record of his vital signs and it is of note that his heart rate has never exceeded 110 beats per minute. Blood pressure control at home seems to be fair.   Has not had any new focal neurological problems but his dementia is progressing. Has not had any bleeding complications but remains unsteady on his feet and prone to falling. He is not on warfarin for this reason, and takes aspirin for stroke prevention in the setting of permanent atrial fibrillation.  Mr. Fok daughter also notes that he appears to be more breathless at times walking short distances and even sometimes at rest. Has not complained of chest pain.  Dose of his thyroid medication was recently increased because of an elevated TSH.   Allergies  Allergen Reactions  . Ace Inhibitors Cough  . Peanut-Containing Drug Products     REACTION: cough    Current Outpatient Prescriptions  Medication Sig Dispense Refill  . aspirin 325 MG tablet Take 325 mg by mouth daily.        . digoxin (LANOXIN) 0.125 MG tablet Take 125 mcg by mouth daily.        Marland Kitchen diltiazem (CARDIZEM CD) 240 MG 24 hr capsule Take 240 mg by mouth daily.        Marland Kitchen donepezil (ARICEPT) 10 MG tablet Take 10 mg by mouth at bedtime.        . finasteride (PROSCAR) 5 MG tablet Take two tablets at bedtime for prostate      . furosemide (LASIX) 20 MG tablet Take 2 tablets every morning and one tablet every  afternoon  90 tablet  6  . levothyroxine (SYNTHROID, LEVOTHROID) 50 MCG tablet Take one tablet by mouth once daily.  30 tablet  5  . losartan (COZAAR) 50 MG tablet Take 50 mg by mouth daily.      . Melatonin 5 MG TABS Take 5 mg by mouth 2 (two) times daily. Take one tablet once daily      . Memantine HCl ER (NAMENDA XR) 28 MG CP24 Take 28 mg by mouth daily. To start this only after completion of titration pack  30 capsule  3  . metFORMIN (GLUCOPHAGE) 1000 MG tablet Take one tablet twice a day for diabetes  60 tablet  5  . metoprolol succinate (TOPROL-XL) 25 MG 24 hr tablet Take 25 mg by mouth daily.      . mirtazapine (REMERON) 30 MG tablet Take 30 mg by mouth at bedtime.        . niacin (NIASPAN) 1000 MG CR tablet Take 1,000 mg by mouth daily.        . nitroGLYCERIN (NITROSTAT) 0.4 MG SL tablet Place 0.4 mg under the tongue every 5 (five) minutes as needed.      . simvastatin (ZOCOR) 40 MG tablet Take 40 mg by mouth every evening.      Marland Kitchen  Tamsulosin HCl (FLOMAX) 0.4 MG CAPS Take 0.4 mg by mouth daily.        . vitamin B-12 (CYANOCOBALAMIN) 1000 MCG tablet Take 1,000 mcg by mouth daily.        No current facility-administered medications for this visit.    Past Medical History  Diagnosis Date  . CHF (congestive heart failure)     diastolic dysfunction  . Atrial fibrillation     permanent  . Colon cancer   . Carotid arterial disease   . COPD (chronic obstructive pulmonary disease)     moderate 2006 by PFT FEV1 1.39  . Hyperlipidemia   . Type 2 diabetes mellitus   . Hypertension   . Hypothyroid   . Pancreatic cyst 12/14/2011    MRI - stable cyst lesion, likely benign  . Myocardial infarct   . Coronary artery disease   . Prostatitis   . Gallstones   . TIA (transient ischemic attack)   . Tremor   . Carotid bruit 12/17/2010    doppler - ICAs normal patency w/o evidence of significant diameter reduction/dissection; R ECA turbulent flowthroughout, possible source of bruit  . Cerebral  atherosclerosis 11/10/2009    doppler - R bulb and proximal ICA 50-69% diameter reduction; R ECA high grade stenosis; L ICA 0-49% diameter reduction  . Carotid bruit 10/21/2006    doppler - R bulb and proximal ICA 50-69% diameter reduction, R ECA high grade stenosis; L ICA 0-49% diameter reduction  . Dementia   . Incomplete bladder emptying   . Acute pyelonephritis without lesion of renal medullary necrosis   . Full incontinence of feces   . Other specified disease of pancreas   . Unspecified disorder of prostate   . Herpes zoster without mention of complication   . Pain in joint, pelvic region and thigh   . Other vitamin B12 deficiency anemia   . Vascular dementia, uncomplicated   . Muscle weakness (generalized)   . Other specified disorder of skin   . Depression   . Coronary atherosclerosis of unspecified type of vessel, native or graft     Past Surgical History  Procedure Laterality Date  . Hernia repair  2003  . Cholecystectomy    . Coronary artery bypass graft  2006    LIMA to LAD; RSV to ICA; RSV to posterior descending CA  . Colon surgery  1989  . Cataract extraction Right 2006  . Tee with cardioversion  04/16/2010    EF 55-60%; LA mod dialted; AF precludes elval of LV diastolic fcn; septal motion consistent w/ post-thoracotomy state  . Cardiac catheterization  12/03/2004    total occulsion of LAD w/ retrograde filing, total occlusion RCA w/ l to r collaterals, 90% proximal optional diagonal disease; normal LV systolic fcn, no renal artery stenosis;     Family History  Problem Relation Age of Onset  . Heart attack Mother   . Breast cancer Mother   . Heart disease Mother 20    myocardiomyopathy  . Alzheimer's disease Sister     History   Social History  . Marital Status: Widowed    Spouse Name: N/A    Number of Children: N/A  . Years of Education: N/A   Occupational History  . Psychologist, sport and exercise     retired   Social History Main Topics  . Smoking status:  Former Smoker -- 2.00 packs/day for 20 years    Types: Cigarettes    Quit date: 07/01/1961  . Smokeless tobacco: Never  Used  . Alcohol Use: No  . Drug Use: No  . Sexually Active: Not on file   Other Topics Concern  . Not on file   Social History Narrative   Pt is Jehovah's Witness    Review of systems: Should be noted that his review of systems is obtained almost entirely from his daughter. Mrs. Haug and clearly hasn't taken his answers because of his dementia and his daughter answers immediately before he can say a word. Having said that his daughter appears to be very observant, caring and well-intentioned.  The patient specifically denies any chest pain at rest or with exertion, orthopnea, paroxysmal nocturnal dyspnea, syncope, palpitations, focal neurological deficits, intermittent claudication, lower extremity edema, cough, hemoptysis or wheezing.  The patient also denies abdominal pain, nausea, vomiting, dysphagia, diarrhea, constipation, polyuria, polydipsia, dysuria, hematuria, frequency, urgency, abnormal bleeding or bruising, fever, chills, unexpected weight changes, mood swings, change in skin or hair texture, change in voice quality, auditory or visual problems, allergic reactions or rashes, new musculoskeletal complaints other than usual "aches and pains".   PHYSICAL EXAM BP 144/62  Pulse 84  Resp 16  Ht 5\' 9"  (1.753 m)  Wt 204 lb 1.6 oz (92.579 kg)  BMI 30.13 kg/m2  General: Alert, oriented x3, no distress Head: no evidence of trauma, PERRL, EOMI, no exophtalmos or lid lag, no myxedema, no xanthelasma; normal ears, nose and oropharynx Neck: normal jugular venous pulsations and no hepatojugular reflux; brisk carotid pulses without delay and no carotid bruits Chest: clear to auscultation, no signs of consolidation by percussion or palpation, normal fremitus, symmetrical and full respiratory excursions; sternotomy scar Cardiovascular: normal position and quality of the  apical impulse, irregular rhythm, normal first and second heart sounds, no murmurs, rubs or gallops Abdomen: no tenderness or distention, no masses by palpation, no abnormal pulsatility or arterial bruits, normal bowel sounds, no hepatosplenomegaly Extremities: no clubbing, cyanosis or edema; 2+ radial, ulnar and brachial pulses bilaterally; 2+ right femoral, posterior tibial and dorsalis pedis pulses; 2+ left femoral, posterior tibial and dorsalis pedis pulses; no subclavian or femoral bruits, healed right saphenectomy scars the right ankle is a little puffy but not overtly swollen2 Neurological: grossly nonfocal   EKG: Atrial fibrillation otherwise normal tracing  Lipid Panel     Component Value Date/Time   TRIG 253* 11/26/2012 0852   HDL 24* 11/26/2012 0852   CHOLHDL 9.0* 11/26/2012 0852   LDLCALC 140* 11/26/2012 0852    BMET    Component Value Date/Time   NA 141 11/26/2012 0852   NA 136 07/22/2012 0915   K 4.2 11/26/2012 0852   CL 101 11/26/2012 0852   CO2 25 11/26/2012 0852   GLUCOSE 120* 11/26/2012 0852   GLUCOSE 133* 07/22/2012 0915   BUN 19 11/26/2012 0852   BUN 19 07/22/2012 0915   CREATININE 1.11 11/26/2012 0852   CALCIUM 9.4 11/26/2012 0852   GFRNONAA 61 11/26/2012 0852   GFRAA 71 11/26/2012 0852     ASSESSMENT AND PLAN ATRIAL FIBRILLATION He has permanent atrial fibrillation. I believe his rate control is adequate but his daughter believes that his ventricular rates have been faster than usual recently. It is possible that he has some mild hypervolemia although he does not appear to be in overt heart failure. I would not change his rate control medications. I would not increase his dose of digoxin simply because his "level is too low". Note that he has preserved left ventricle systolic function and normal renal function. Dose of  thyroid medications as recently adjusted and we will check a TSH to make sure that he is not iatrogenically hyperthyroid. He he has had previous falls and is  at risk for further injury. He is only on aspirin for stroke prophylaxis for this reason.  HYPERTENSION Fair control. His blood pressure today is slightly high but most of the readings at home are within the desirable range; no adjustments are made to the medications  CHF By clinical criteria he appears to be euvolemic, but his daughter has noticed that he is more dyspneic and his weight is indeed roughly 4 pounds above what it was in December. I think it is reasonable that he receive a "sliding scale" prescription for furosemide. His home scale shows roughly 3 pounds less in our office scale. He can take to twice the usual morning dose of torsemide if his weights at home exceed 197 pounds. This should be accompanied by an increased intake of potassium from food. His daughter thinks this is preferable to administering a large potassium supplements tablet. Also check an echocardiogram to make sure that the changes are not related to worsening left ventricular function  CAROTID ARTERY DISEASE Carotid ultrasound performed within the last couple of years showed minor plaque without significant stenoses  HYPERLIPIDEMIA As of the potential adverse interaction of diltiazem and simvastatin will switch him today to pravastatin 40 mg at bedtime daily . I am not certain but I believe the most recent lipid profile may be performed when he was off statin therapy. One year ago while taking the same dose of simvastatin his total cholesterol is 150, HDL 27 nitroglycerin 146, With LDL 94. It is also possible that his mild hypothyroidism may have contributed to some degree to the higher than usual cholesterol levels. I will repeat his lipid profile in another few months now that he is switching to a different statin.  CAD (coronary artery disease) s/p CABG 2006 He has no symptoms of coronary insufficiency. No coronary events have occurred since his bypass procedure in 2006.  Orders Placed This Encounter  Procedures   . B Nat Peptide  . TSH  . EKG 12-Lead  . 2D Echocardiogram without contrast   Meds ordered this encounter  Medications  . pravastatin 40 MG tablet    Sig: Take 40 mg by mouth every evening.  . furosemide (LASIX) 20 MG tablet    Sig: Take 2 tablets every morning and one tablet every afternoon    Dispense:  90 tablet    Refill:  6    Srikar Chiang  Thurmon Fair, MD, [facility] [phone] office [phone] pager

## 2013-01-18 ENCOUNTER — Telehealth: Payer: Self-pay | Admitting: *Deleted

## 2013-01-18 NOTE — Telephone Encounter (Signed)
Message copied by Vita Barley on Mon Jan 18, 2013 11:24 AM ------      Message from: Thurmon Fair      Created: Sat Jan 16, 2013  4:13 PM       Thyroid is OK. BNP does not show evidence of fluid overload at this time ------

## 2013-01-18 NOTE — Telephone Encounter (Signed)
Normal lab results called to pt and son.  Voiced understanding.

## 2013-01-20 ENCOUNTER — Ambulatory Visit (HOSPITAL_COMMUNITY)
Admission: RE | Admit: 2013-01-20 | Discharge: 2013-01-20 | Disposition: A | Discharge status: Home / Self Care | Payer: Medicare Other | Source: Ambulatory Visit | Attending: Cardiovascular Disease | Admitting: Cardiovascular Disease

## 2013-01-20 DIAGNOSIS — R0602 Shortness of breath: Secondary | ICD-10-CM

## 2013-01-20 DIAGNOSIS — J4489 Other specified chronic obstructive pulmonary disease: Secondary | ICD-10-CM | POA: Insufficient documentation

## 2013-01-20 DIAGNOSIS — I10 Essential (primary) hypertension: Secondary | ICD-10-CM | POA: Insufficient documentation

## 2013-01-20 DIAGNOSIS — J449 Chronic obstructive pulmonary disease, unspecified: Secondary | ICD-10-CM | POA: Insufficient documentation

## 2013-01-20 DIAGNOSIS — I509 Heart failure, unspecified: Secondary | ICD-10-CM | POA: Insufficient documentation

## 2013-01-20 DIAGNOSIS — I2510 Atherosclerotic heart disease of native coronary artery without angina pectoris: Secondary | ICD-10-CM | POA: Insufficient documentation

## 2013-01-20 DIAGNOSIS — E119 Type 2 diabetes mellitus without complications: Secondary | ICD-10-CM | POA: Insufficient documentation

## 2013-01-20 DIAGNOSIS — I4891 Unspecified atrial fibrillation: Secondary | ICD-10-CM | POA: Insufficient documentation

## 2013-01-20 NOTE — Progress Notes (Signed)
[facility]   2D echo completed 01/20/2013.   Cindy Ily Denno, RDCS  

## 2013-01-25 ENCOUNTER — Other Ambulatory Visit: Payer: Self-pay | Admitting: Geriatric Medicine

## 2013-01-26 ENCOUNTER — Other Ambulatory Visit: Payer: Self-pay | Admitting: Geriatric Medicine

## 2013-01-26 MED ORDER — DONEPEZIL HCL 10 MG PO TABS: 10.0000 mg | ORAL_TABLET | Freq: Every day | ORAL | Status: DC

## 2013-02-02 ENCOUNTER — Other Ambulatory Visit: Payer: Self-pay | Admitting: Geriatric Medicine

## 2013-02-02 MED ORDER — MIRTAZAPINE 30 MG PO TABS: 30.0000 mg | ORAL_TABLET | Freq: Every day | ORAL | Status: DC

## 2013-02-19 ENCOUNTER — Other Ambulatory Visit: Payer: Medicare Other

## 2013-02-19 ENCOUNTER — Other Ambulatory Visit: Payer: Self-pay | Admitting: *Deleted

## 2013-02-19 DIAGNOSIS — E785 Hyperlipidemia, unspecified: Secondary | ICD-10-CM

## 2013-02-19 DIAGNOSIS — E039 Hypothyroidism, unspecified: Secondary | ICD-10-CM

## 2013-02-19 DIAGNOSIS — I10 Essential (primary) hypertension: Secondary | ICD-10-CM

## 2013-02-20 LAB — HEMOGLOBIN A1C
Est. average glucose Bld gHb Est-mCnc: 166 mg/dL
Hgb A1c MFr Bld: 7.4 % — ABNORMAL HIGH (ref 4.8–5.6)

## 2013-02-23 ENCOUNTER — Encounter: Payer: Self-pay | Admitting: Internal Medicine

## 2013-02-23 ENCOUNTER — Telehealth: Payer: Self-pay | Admitting: Cardiovascular Disease

## 2013-02-23 ENCOUNTER — Ambulatory Visit (INDEPENDENT_AMBULATORY_CARE_PROVIDER_SITE_OTHER): Payer: Medicare Other | Admitting: Internal Medicine

## 2013-02-23 VITALS — BP 122/58 | HR 80 | Wt 209.0 lb

## 2013-02-23 DIAGNOSIS — E119 Type 2 diabetes mellitus without complications: Secondary | ICD-10-CM

## 2013-02-23 DIAGNOSIS — I4891 Unspecified atrial fibrillation: Secondary | ICD-10-CM

## 2013-02-23 DIAGNOSIS — I2510 Atherosclerotic heart disease of native coronary artery without angina pectoris: Secondary | ICD-10-CM

## 2013-02-23 DIAGNOSIS — G5790 Unspecified mononeuropathy of unspecified lower limb: Secondary | ICD-10-CM

## 2013-02-23 DIAGNOSIS — I10 Essential (primary) hypertension: Secondary | ICD-10-CM

## 2013-02-23 DIAGNOSIS — M792 Neuralgia and neuritis, unspecified: Secondary | ICD-10-CM

## 2013-02-23 DIAGNOSIS — F0280 Dementia in other diseases classified elsewhere without behavioral disturbance: Secondary | ICD-10-CM

## 2013-02-23 DIAGNOSIS — E785 Hyperlipidemia, unspecified: Secondary | ICD-10-CM

## 2013-02-23 DIAGNOSIS — E039 Hypothyroidism, unspecified: Secondary | ICD-10-CM

## 2013-02-23 MED ORDER — GABAPENTIN 100 MG PO CAPS: 100.0000 mg | ORAL_CAPSULE | Freq: Two times a day (BID) | ORAL | Status: DC

## 2013-02-23 MED ORDER — NITROGLYCERIN 0.4 MG SL SUBL: 0.4000 mg | SUBLINGUAL_TABLET | SUBLINGUAL | Status: DC | PRN

## 2013-02-23 NOTE — Telephone Encounter (Signed)
Returned call and informed Stanton Kidney per instructions by MD/PA.  Verbalized understanding and agreed w/ plan.  Appt scheduled for Friday, 8/29 at 11am w/ B. Hager, PA-C.

## 2013-02-23 NOTE — Telephone Encounter (Signed)
Nathaniel Lopez is wanting to speak with you about the weight gain from 200-213 and higher bp in 150/90 range even though the fluid pills has been increase has shortness of breath even with speech  .Marland Kitchen Seen Dr.Croitoru on 01/15/13.. Need directions on what to do. Please Call    Thanks

## 2013-02-23 NOTE — Progress Notes (Signed)
Patient ID: Nathaniel Lopez, male   DOB: 1927-12-31, 79 y.o.   MRN: [id number]  Chief Complaint  Patient presents with  . Medical Managment of Chronic Issues    f/u on DM, HTN, and Dementia   . Medication Management    discuss rx for Nitro pill vs spray     HPI 79 y/o male patient here with his daughter in law for routine follow up. She would like his NTG spray to be changed to tablet. He has been seen by his cardiologist. Reviewed his labs He has been having pain and burning sensation in both his feet for more than a month He has not been complain with his diet. He has been eating outside food more His mood has been stable No recent behavior changes  Review of Systems  Constitutional: Negative for fever, chills and diaphoresis.  HENT: Negative for sore throat.   Eyes: Negative for blurred vision.  Respiratory: Negative for cough and shortness of breath.   Cardiovascular: Positive for leg swelling. Negative for chest pain and palpitations.  Gastrointestinal: Negative for heartburn, nausea, vomiting, abdominal pain and diarrhea.  Genitourinary: Negative for dysuria.  Musculoskeletal: Negative for myalgias and falls.  Skin: Negative for itching and rash.  Neurological: Negative for dizziness, seizures, weakness and headaches.  Psychiatric/Behavioral: Positive for memory loss. Negative for depression. The patient does not have insomnia.    Allergies  Allergen Reactions  . Ace Inhibitors Cough  . Peanut-Containing Drug Products     REACTION: cough    Past Medical History  Diagnosis Date  . CHF (congestive heart failure)     diastolic dysfunction  . Atrial fibrillation     permanent  . Colon cancer   . Carotid arterial disease   . COPD (chronic obstructive pulmonary disease)     moderate 2006 by PFT FEV1 1.39  . Hyperlipidemia   . Type 2 diabetes mellitus   . Hypertension   . Hypothyroid   . Pancreatic cyst 12/14/2011    MRI - stable cyst lesion, likely benign  . Myocardial  infarct   . Coronary artery disease   . Prostatitis   . Gallstones   . TIA (transient ischemic attack)   . Tremor   . Carotid bruit 12/17/2010    doppler - ICAs normal patency w/o evidence of significant diameter reduction/dissection; R ECA turbulent flowthroughout, possible source of bruit  . Cerebral atherosclerosis 11/10/2009    doppler - R bulb and proximal ICA 50-69% diameter reduction; R ECA high grade stenosis; L ICA 0-49% diameter reduction  . Carotid bruit 10/21/2006    doppler - R bulb and proximal ICA 50-69% diameter reduction, R ECA high grade stenosis; L ICA 0-49% diameter reduction  . Dementia   . Incomplete bladder emptying   . Acute pyelonephritis without lesion of renal medullary necrosis   . Full incontinence of feces   . Other specified disease of pancreas   . Unspecified disorder of prostate   . Herpes zoster without mention of complication   . Pain in joint, pelvic region and thigh   . Other vitamin B12 deficiency anemia   . Vascular dementia, uncomplicated   . Muscle weakness (generalized)   . Other specified disorder of skin   . Depression   . Coronary atherosclerosis of unspecified type of vessel, native or graft     Past Surgical History  Procedure Laterality Date  . Hernia repair  2003  . Cholecystectomy    . Coronary artery bypass graft  2006  LIMA to LAD; RSV to ICA; RSV to posterior descending CA  . Colon surgery  1989  . Cataract extraction Right 2006  . Tee with cardioversion  04/16/2010    EF 55-60%; LA mod dialted; AF precludes elval of LV diastolic fcn; septal motion consistent w/ post-thoracotomy state  . Cardiac catheterization  12/03/2004    total occulsion of LAD w/ retrograde filing, total occlusion RCA w/ l to r collaterals, 90% proximal optional diagonal disease; normal LV systolic fcn, no renal artery stenosis;     Current Outpatient Prescriptions on File Prior to Visit  Medication Sig Dispense Refill  . aspirin 325 MG tablet Take 325  mg by mouth daily.        . digoxin (LANOXIN) 0.125 MG tablet Take 125 mcg by mouth daily.        Marland Kitchen diltiazem (CARDIZEM CD) 240 MG 24 hr capsule Take 240 mg by mouth daily.        Marland Kitchen donepezil (ARICEPT) 10 MG tablet Take 1 tablet (10 mg total) by mouth at bedtime.  30 tablet  3  . furosemide (LASIX) 20 MG tablet Take 2 tablets every morning and one tablet every afternoon  90 tablet  6  . levothyroxine (SYNTHROID, LEVOTHROID) 50 MCG tablet Take one tablet by mouth once daily.  30 tablet  5  . losartan (COZAAR) 50 MG tablet Take 50 mg by mouth daily.      . Melatonin 5 MG TABS Take 5 mg by mouth 2 (two) times daily. Take one tablet once daily      . Memantine HCl ER (NAMENDA XR) 28 MG CP24 Take 28 mg by mouth daily. To start this only after completion of titration pack  30 capsule  3  . metFORMIN (GLUCOPHAGE) 1000 MG tablet Take one tablet twice a day for diabetes  60 tablet  5  . metoprolol succinate (TOPROL-XL) 25 MG 24 hr tablet Take 25 mg by mouth daily.      . mirtazapine (REMERON) 30 MG tablet Take 1 tablet (30 mg total) by mouth at bedtime.  30 tablet  3  . niacin (NIASPAN) 1000 MG CR tablet Take 1,000 mg by mouth daily.        . pravastatin (PRAVACHOL) 40 MG tablet Take 1 tablet (40 mg total) by mouth every evening.  90 tablet  3  . Tamsulosin HCl (FLOMAX) 0.4 MG CAPS Take 0.4 mg by mouth daily.        . vitamin B-12 (CYANOCOBALAMIN) 1000 MCG tablet Take 1,000 mcg by mouth daily.        No current facility-administered medications on file prior to visit.    Physical exam  BP 122/58  Pulse 80  Wt 209 lb (94.802 kg)  BMI 30.85 kg/m2  SpO2 95%  Constitutional: He appears well-developed and well-nourished. No distress.  HENT:   Head: Normocephalic and atraumatic.   Mouth/Throat: Oropharynx is clear and moist.  Eyes: Conjunctivae are normal. Pupils are equal, round, and reactive to light.  Neck: Normal range of motion. Neck supple. No JVD present. No tracheal deviation present. No  thyromegaly present.  Cardiovascular: Normal rate, regular rhythm and normal heart sounds.   Pulmonary/Chest: Effort normal and breath sounds normal. No respiratory distress. He has no wheezes.  Sternotomy scar present  Abdominal: Soft. Bowel sounds are normal. There is no tenderness.  Musculoskeletal: Normal range of motion. He exhibits no edema.  Uses a cane and has occassional involuntary jerks in both legs and arms  Neurological: He is alert. No cranial nerve deficit.  Oriented to person and place  Skin: Skin is warm and dry.  Psychiatric: He has a normal mood and affect.   Labs reviewed  02/19/13 a1c 7.4 01/15/13 tsh 3.470, bnp 97  Assessment/plan  Peripheral neuropathy- likely in setting of dm. will have him started on neurontin 100 mg bid for now  Dementia- mixed picture of alzhimer's, vascular and hypothyroidism. Continue namenda and aricept for now  CAD- remains chest pain free. Continue current b blcoker, ACEI and ASA. Continue statin  htn- bp well controlled in office today. Reviewed home bp readings. Continue current regimen. o changes made  Dm type 2- slightly worsened from last visit. Pt has not been comlaint with his diet. Dietary restriction reinforced with family. Will continue metformin for now. Check a1c, urine microalbumin prior to next visit. Continue asa, statin, losartan for now  afib- continue AV nodal blocking agent and ASA. Continue digoxin  CHF- stable, will not make any changes. Continue lasix  Hypothyroidism- continue levothyroxine for now. Monitor tsh periodically. imporved tsh

## 2013-02-23 NOTE — Telephone Encounter (Signed)
Returned call to [employer].  Stated pt's wt topped out at 213 lbs this week.  Stated she increased furosemide to 40 mg AM and 20 mg PM.  Stated pt's legs have pitting edema and pt has SOB w/ talking.  Stated it is getting better and pt's weight is down to 205 lbs today.  Stated pt's ideal weight in 197 lbs, but is usually around 200 lbs.  Stanton Kidney wants to know if she should continue the furosemide or not or what to do.  Asked if pt is in any distress currently and denied he is.  Stated he's better, but has a way to go to lose some more weight.  Informed Dr. Salena Saner will be notified for further instructions.    Message forwarded to Dr. Royann Shivers.  Stanton Kidney stated she is in a meeting and may not be able to answer, but will try to take the call.  Informed RN will call back once response given.

## 2013-02-24 NOTE — Telephone Encounter (Signed)
**  Late Entry** Per Dr. Royann Shivers, Lasix 40 mg BID and appt in next 2-3 days.

## 2013-02-26 ENCOUNTER — Ambulatory Visit (INDEPENDENT_AMBULATORY_CARE_PROVIDER_SITE_OTHER): Payer: Medicare Other | Admitting: Physician Assistant

## 2013-02-26 ENCOUNTER — Encounter: Payer: Self-pay | Admitting: Physician Assistant

## 2013-02-26 VITALS — BP 126/56 | HR 88 | Ht 69.0 in | Wt 201.9 lb

## 2013-02-26 DIAGNOSIS — I10 Essential (primary) hypertension: Secondary | ICD-10-CM

## 2013-02-26 DIAGNOSIS — Z79899 Other long term (current) drug therapy: Secondary | ICD-10-CM

## 2013-02-26 DIAGNOSIS — I5032 Chronic diastolic (congestive) heart failure: Secondary | ICD-10-CM

## 2013-02-26 DIAGNOSIS — I4891 Unspecified atrial fibrillation: Secondary | ICD-10-CM

## 2013-02-26 NOTE — Assessment & Plan Note (Signed)
Patient has responded nicely to increased Lasix. His daughter will be monitoring more closely since she has quit her job to do so. We'll go ahead and drop his Lasix back to 20 mg twice daily and she will titrated as needed based on the weight gain. We will check a basic metabolic panel to monitor his kidney function. His last creatinine in May was 1.11.  We did discuss sodium reduction which may be difficult given the patient has frequent at fast food restaurants when not supervised.

## 2013-02-26 NOTE — Assessment & Plan Note (Signed)
Blood pressure well controlled

## 2013-02-26 NOTE — Progress Notes (Signed)
Date:  02/26/2013   ID:  Nathaniel Lopez, DOB 1927/07/05, MRN [id number]  PCP:  Oneal Grout, MD  Primary Cardiologist:  [facility]     History of Present Illness: Nathaniel Lopez is a 79 y.o. male history of chronic atrial fibrillation, diastolic heart failure, carotid arterial disease, COPD, hyperlipidemia, and diabetes mellitus, hypertension, chronic coronary disease, dementia.  Patient currently lives by himself and is checked on regularly by family who also do most of the shopping now. Patient does eat at fast food restaurants on regular basis and does not watch his sodium intake.  Patient had a sudden increase in weight up to 213 pounds. His Lasix was increased to 40 mg twice daily and he has responded nicely with decreasing to 201 pounds.  According to his daughter he did have some dyspnea and possible PND symptoms along with lower extremity edema however the patient has denied dyspnea.  The patient currently denies nausea, vomiting, fever, chest pain, dizziness, PND, cough, congestion, abdominal pain, hematochezia, melena.  Wt Readings from Last 3 Encounters:  02/26/13 201 lb 14.4 oz (91.581 kg)  02/23/13 209 lb (94.802 kg)  01/15/13 204 lb 1.6 oz (92.579 kg)     Past Medical History  Diagnosis Date  . CHF (congestive heart failure)     diastolic dysfunction  . Atrial fibrillation     permanent  . Colon cancer   . Carotid arterial disease   . COPD (chronic obstructive pulmonary disease)     moderate 2006 by PFT FEV1 1.39  . Hyperlipidemia   . Type 2 diabetes mellitus   . Hypertension   . Hypothyroid   . Pancreatic cyst 12/14/2011    MRI - stable cyst lesion, likely benign  . Myocardial infarct   . Coronary artery disease   . Prostatitis   . Gallstones   . TIA (transient ischemic attack)   . Tremor   . Carotid bruit 12/17/2010    doppler - ICAs normal patency w/o evidence of significant diameter reduction/dissection; R ECA turbulent flowthroughout, possible source of bruit   . Cerebral atherosclerosis 11/10/2009    doppler - R bulb and proximal ICA 50-69% diameter reduction; R ECA high grade stenosis; L ICA 0-49% diameter reduction  . Carotid bruit 10/21/2006    doppler - R bulb and proximal ICA 50-69% diameter reduction, R ECA high grade stenosis; L ICA 0-49% diameter reduction  . Dementia   . Incomplete bladder emptying   . Acute pyelonephritis without lesion of renal medullary necrosis   . Full incontinence of feces   . Other specified disease of pancreas   . Unspecified disorder of prostate   . Herpes zoster without mention of complication   . Pain in joint, pelvic region and thigh   . Other vitamin B12 deficiency anemia   . Vascular dementia, uncomplicated   . Muscle weakness (generalized)   . Other specified disorder of skin   . Depression   . Coronary atherosclerosis of unspecified type of vessel, native or graft     Current Outpatient Prescriptions  Medication Sig Dispense Refill  . aspirin 325 MG tablet Take 325 mg by mouth daily.        . digoxin (LANOXIN) 0.125 MG tablet Take 125 mcg by mouth daily.        Marland Kitchen diltiazem (CARDIZEM CD) 240 MG 24 hr capsule Take 240 mg by mouth daily.        Marland Kitchen donepezil (ARICEPT) 10 MG tablet Take 1 tablet (10 mg total)  by mouth at bedtime.  30 tablet  3  . furosemide (LASIX) 20 MG tablet Take 2 tablets every morning and one tablet every afternoon  90 tablet  6  . gabapentin (NEURONTIN) 100 MG capsule Take 1 capsule (100 mg total) by mouth 2 (two) times daily.  90 capsule  3  . levothyroxine (SYNTHROID, LEVOTHROID) 50 MCG tablet Take one tablet by mouth once daily.  30 tablet  5  . losartan (COZAAR) 50 MG tablet Take 50 mg by mouth daily.      . Melatonin 5 MG TABS Take 5 mg by mouth 2 (two) times daily. Take one tablet once daily      . Memantine HCl ER (NAMENDA XR) 28 MG CP24 Take 28 mg by mouth daily. To start this only after completion of titration pack  30 capsule  3  . metFORMIN (GLUCOPHAGE) 1000 MG tablet  Take one tablet twice a day for diabetes  60 tablet  5  . metoprolol succinate (TOPROL-XL) 25 MG 24 hr tablet Take 25 mg by mouth daily.      . mirtazapine (REMERON) 30 MG tablet Take 1 tablet (30 mg total) by mouth at bedtime.  30 tablet  3  . niacin (NIASPAN) 1000 MG CR tablet Take 1,000 mg by mouth daily.        . nitroGLYCERIN (NITROSTAT) 0.4 MG SL tablet Place 1 tablet (0.4 mg total) under the tongue every 5 (five) minutes as needed.  30 tablet  3  . pravastatin (PRAVACHOL) 40 MG tablet Take 1 tablet (40 mg total) by mouth every evening.  90 tablet  3  . Tamsulosin HCl (FLOMAX) 0.4 MG CAPS Take 0.4 mg by mouth daily.        . vitamin B-12 (CYANOCOBALAMIN) 1000 MCG tablet Take 1,000 mcg by mouth daily.        No current facility-administered medications for this visit.    Allergies:    Allergies  Allergen Reactions  . Ace Inhibitors Cough  . Peanut-Containing Drug Products     REACTION: cough    Social History:  The patient  reports that he quit smoking about 44 years ago. His smoking use included Cigarettes. He has a 40 pack-year smoking history. He has never used smokeless tobacco. He reports that he drinks about 0.6 ounces of alcohol per week. He reports that he does not use illicit drugs.   Family history:   Family History  Problem Relation Age of Onset  . Heart attack Mother   . Breast cancer Mother   . Heart disease Mother 38    myocardiomyopathy  . Alzheimer's disease Sister     ROS:  Please see the history of present illness.  All other systems reviewed and negative.   PHYSICAL EXAM: VS:  BP 126/56  Pulse 88  Ht 5\' 9"  (1.753 m)  Wt 201 lb 14.4 oz (91.581 kg)  BMI 29.8 kg/m2 Well nourished, well developed, in no acute distress HEENT: Pupils are equal round react to light accommodation extraocular movements are intact.  Neck: no JVDNo cervical lymphadenopathy. No carotid bruit Cardiac: Irregular rate and rhythm without murmurs rubs or gallops. Lungs:  clear to  auscultation bilaterally, no wheezing, rhonchi or rales Abd: soft, nontender, positive bowel sounds all quadrants, no hepatosplenomegaly Ext: 1+ bilateral ankle edema.  2+ radial and dorsalis pedis pulses. Skin: warm and dry Neuro:  Grossly normal  ASSESSMENT AND PLAN:  Problem List Items Addressed This Visit   HYPERTENSION  Blood pressure well controlled.    Chronic diastolic heart failure, acute exacerbation     Patient has responded nicely to increased Lasix. His daughter will be monitoring more closely since she has quit her job to do so. We'll go ahead and drop his Lasix back to 20 mg twice daily and she will titrated as needed based on the weight gain. We will check a basic metabolic panel to monitor his kidney function. His last creatinine in May was 1.11.  We did discuss sodium reduction which may be difficult given the patient has frequent at fast food restaurants when not supervised.    ATRIAL FIBRILLATION    Other Visit Diagnoses   Encounter for long-term (current) use of other medications    -  Primary    Relevant Orders       Basic Metabolic Panel (BMET)

## 2013-02-26 NOTE — Patient Instructions (Signed)
Continue to monitor weight daily. Decrease sodium intake as much as possible to less than 2000 mg daily.  Decrease Lasix to 20 mg twice daily. If he gains 2 pounds in 24 hours or 5 pounds in 7 days, increase Lasix to 40 mg twice daily until the patient returns to his baseline weight of 198#.  Follow up with Dr. Salena Saner. in 6 months.

## 2013-02-27 LAB — BASIC METABOLIC PANEL
CO2: 27 mEq/L (ref 19–32)
Chloride: 99 mEq/L (ref 96–112)
Potassium: 4.5 mEq/L (ref 3.5–5.3)
Sodium: 141 mEq/L (ref 135–145)

## 2013-03-02 ENCOUNTER — Other Ambulatory Visit: Payer: Self-pay | Admitting: *Deleted

## 2013-03-02 MED ORDER — LOSARTAN POTASSIUM 50 MG PO TABS: 50.0000 mg | ORAL_TABLET | Freq: Every day | ORAL | Status: DC

## 2013-03-02 NOTE — Telephone Encounter (Signed)
Rx was sent to pharmacy electronically. 

## 2013-04-02 ENCOUNTER — Telehealth: Payer: Self-pay | Admitting: *Deleted

## 2013-04-02 NOTE — Telephone Encounter (Signed)
Message copied by Vita Barley on Fri Apr 02, 2013  7:46 AM ------      Message from: Cleon Gustin D      Created: Thu Apr 01, 2013  1:44 PM      Regarding: Echocardiogram       Hi      This patient has a recall to have an echo on 04/20/2013 however he just had on in July. Does he need another echo so soon?            Ebony ------

## 2013-04-02 NOTE — Telephone Encounter (Signed)
He does not need another echo

## 2013-04-02 NOTE — Telephone Encounter (Signed)
Message sent to Dr. Salena Saner for response.

## 2013-04-13 ENCOUNTER — Other Ambulatory Visit: Payer: Self-pay | Admitting: *Deleted

## 2013-04-13 MED ORDER — DIGOXIN 125 MCG PO TABS: 125.0000 ug | ORAL_TABLET | Freq: Every day | ORAL | Status: DC

## 2013-05-03 ENCOUNTER — Ambulatory Visit: Payer: Medicare Other

## 2013-05-03 DIAGNOSIS — Z23 Encounter for immunization: Secondary | ICD-10-CM

## 2013-05-10 ENCOUNTER — Other Ambulatory Visit: Payer: Self-pay | Admitting: *Deleted

## 2013-05-10 MED ORDER — MEMANTINE HCL ER 28 MG PO CP24: 1.0000 | ORAL_CAPSULE | Freq: Every day | ORAL | Status: DC

## 2013-05-10 MED ORDER — DILTIAZEM HCL ER COATED BEADS 240 MG PO CP24: 240.0000 mg | ORAL_CAPSULE | Freq: Every day | ORAL | Status: DC

## 2013-05-25 ENCOUNTER — Other Ambulatory Visit: Payer: Medicare Other

## 2013-05-25 DIAGNOSIS — M792 Neuralgia and neuritis, unspecified: Secondary | ICD-10-CM

## 2013-05-25 DIAGNOSIS — I2510 Atherosclerotic heart disease of native coronary artery without angina pectoris: Secondary | ICD-10-CM

## 2013-05-25 DIAGNOSIS — E119 Type 2 diabetes mellitus without complications: Secondary | ICD-10-CM

## 2013-05-25 DIAGNOSIS — I10 Essential (primary) hypertension: Secondary | ICD-10-CM

## 2013-05-25 DIAGNOSIS — E039 Hypothyroidism, unspecified: Secondary | ICD-10-CM

## 2013-05-26 ENCOUNTER — Other Ambulatory Visit: Payer: Self-pay

## 2013-05-26 LAB — CBC WITH DIFFERENTIAL/PLATELET
Basophils Absolute: 0 10*3/uL (ref 0.0–0.2)
Basos: 0 %
Eosinophils Absolute: 0.1 10*3/uL (ref 0.0–0.4)
Hemoglobin: 14.4 g/dL (ref 12.6–17.7)
Lymphs: 28 %
MCH: 30.5 pg (ref 26.6–33.0)
MCHC: 33.7 g/dL (ref 31.5–35.7)
MCV: 91 fL (ref 79–97)
Monocytes Absolute: 0.5 10*3/uL (ref 0.1–0.9)
Neutrophils Absolute: 4.9 10*3/uL (ref 1.4–7.0)
Neutrophils Relative %: 63 %
RBC: 4.72 x10E6/uL (ref 4.14–5.80)

## 2013-05-26 LAB — COMPREHENSIVE METABOLIC PANEL
AST: 14 IU/L (ref 0–40)
Albumin/Globulin Ratio: 1.6 (ref 1.1–2.5)
Albumin: 4.4 g/dL (ref 3.5–4.7)
Alkaline Phosphatase: 165 IU/L — ABNORMAL HIGH (ref 39–117)
BUN/Creatinine Ratio: 18 (ref 10–22)
BUN: 22 mg/dL (ref 8–27)
Creatinine, Ser: 1.25 mg/dL (ref 0.76–1.27)
GFR calc non Af Amer: 53 mL/min/{1.73_m2} — ABNORMAL LOW (ref >59)
Globulin, Total: 2.8 g/dL (ref 1.5–4.5)
Sodium: 143 mmol/L (ref 134–144)

## 2013-05-26 LAB — TSH: TSH: 4.96 u[IU]/mL — ABNORMAL HIGH (ref 0.450–4.500)

## 2013-06-01 ENCOUNTER — Ambulatory Visit (INDEPENDENT_AMBULATORY_CARE_PROVIDER_SITE_OTHER): Payer: Medicare Other | Admitting: Internal Medicine

## 2013-06-01 VITALS — BP 118/66 | HR 60 | Resp 12 | Wt 193.0 lb

## 2013-06-01 DIAGNOSIS — G5790 Unspecified mononeuropathy of unspecified lower limb: Secondary | ICD-10-CM

## 2013-06-01 DIAGNOSIS — M792 Neuralgia and neuritis, unspecified: Secondary | ICD-10-CM

## 2013-06-01 DIAGNOSIS — E785 Hyperlipidemia, unspecified: Secondary | ICD-10-CM

## 2013-06-01 DIAGNOSIS — E1149 Type 2 diabetes mellitus with other diabetic neurological complication: Secondary | ICD-10-CM

## 2013-06-01 DIAGNOSIS — I4891 Unspecified atrial fibrillation: Secondary | ICD-10-CM

## 2013-06-01 DIAGNOSIS — E039 Hypothyroidism, unspecified: Secondary | ICD-10-CM

## 2013-06-01 DIAGNOSIS — F0280 Dementia in other diseases classified elsewhere without behavioral disturbance: Secondary | ICD-10-CM

## 2013-06-01 DIAGNOSIS — I10 Essential (primary) hypertension: Secondary | ICD-10-CM

## 2013-06-01 DIAGNOSIS — E1142 Type 2 diabetes mellitus with diabetic polyneuropathy: Secondary | ICD-10-CM | POA: Insufficient documentation

## 2013-06-01 MED ORDER — GABAPENTIN 100 MG PO CAPS: ORAL_CAPSULE | ORAL | Status: DC

## 2013-06-01 MED ORDER — METFORMIN HCL 500 MG PO TABS: ORAL_TABLET | ORAL | Status: DC

## 2013-06-01 NOTE — Progress Notes (Signed)
Patient ID: Nathaniel Lopez, male   DOB: 07/10/1927, 79 y.o.   MRN: [id number]  Chief Complaint  Patient presents with  . Medical Managment of Chronic Issues    3 month follow-up  . Medication Management    Discuss Namenda    Allergies  Allergen Reactions  . Ace Inhibitors Cough  . Peanut-Containing Drug Products     REACTION: cough   HPI 79 y/o male patient is here with his daughter in law. His dementia persists and has progressed. The namenda is not being cos effective and daughter in law would like to stop it.  cbg ranges between 103-144 with one reading of 301  Weight stable between 191-197 and now at 192 lb  SBP 121-142 and DBP 57-68  He lives with his son and daughter in law 3 days a week and by himself for rest of the week. He needs help with his ADLs more now. He drives minimally  His leg pain i somewhat improved but still bothers him  Reviewed his lab results  Review of Systems  Constitutional: Negative for fever, chills and diaphoresis.  HENT: Negative for sore throat.   Eyes: Negative for blurred vision.  Respiratory: Negative for cough and shortness of breath.   Cardiovascular: Negative for chest pain and palpitations.  Gastrointestinal: Negative for heartburn, nausea, vomiting, abdominal pain and diarrhea.  Genitourinary: Negative for dysuria.  Musculoskeletal: Negative for myalgias and falls.  Skin: Negative for itching and rash.  Neurological: Negative for dizziness, seizures, weakness and headaches.  Psychiatric/Behavioral: Positive for memory loss. Negative for depression. The patient does not have insomnia.    Exam- BP 118/66  Pulse 60  Resp 12  Wt 193 lb (87.544 kg)  Constitutional: He appears well-developed and well-nourished. No distress.   HENT:   Head: Normocephalic and atraumatic.   Mouth/Throat: Oropharynx is clear and moist.   Eyes: Conjunctivae are normal. Pupils are equal, round, and reactive to light.   Neck: Normal range of motion. Neck  supple. No JVD present. No tracheal deviation present. No thyromegaly present.   Cardiovascular: Normal rate, regular rhythm and normal heart sounds.    Pulmonary/Chest: Effort normal and breath sounds normal. No respiratory distress. He has no wheezes.  Sternotomy scar present   Abdominal: Soft. Bowel sounds are normal. There is no tenderness.  Musculoskeletal: Normal range of motion. He exhibits no edema.  Uses a cane and has occassional involuntary jerks in both legs and arms Neurological: He is alert. No cranial nerve deficit.  Oriented to person and place   Skin: Skin is warm and dry.  Psychiatric: He has a normal mood and affect.    Labs- CBC    Component Value Date/Time   WBC 7.8 05/25/2013 0921   WBC 4.8 07/22/2012 0915   RBC 4.72 05/25/2013 0921   RBC 4.00* 07/22/2012 0915   HGB 14.4 05/25/2013 0921   HCT 42.7 05/25/2013 0921   PLT 246 07/22/2012 0915   MCV 91 05/25/2013 0921   MCH 30.5 05/25/2013 0921   MCH 31.0 07/22/2012 0915   MCHC 33.7 05/25/2013 0921   MCHC 33.7 07/22/2012 0915   RDW 14.3 05/25/2013 0921   RDW 14.4 07/22/2012 0915   LYMPHSABS 2.2 05/25/2013 0921   LYMPHSABS 1.1 07/22/2012 0915   MONOABS 0.6 07/22/2012 0915   EOSABS 0.1 05/25/2013 0921   EOSABS 0.1 07/22/2012 0915   BASOSABS 0.0 05/25/2013 0921   BASOSABS 0.0 07/22/2012 0915    CMP     Component Value Date/Time  NA 143 05/25/2013 0921   NA 141 02/26/2013 1130   K 4.5 05/25/2013 0921   CL 100 05/25/2013 0921   CO2 24 05/25/2013 0921   GLUCOSE 128* 05/25/2013 0921   GLUCOSE 94 02/26/2013 1130   BUN 22 05/25/2013 0921   BUN 18 02/26/2013 1130   CREATININE 1.25 05/25/2013 0921   CREATININE 1.08 02/26/2013 1130   CALCIUM 10.1 05/25/2013 0921   PROT 7.2 05/25/2013 0921   PROT 6.8 07/22/2012 0915   ALBUMIN 3.3* 07/22/2012 0915   AST 14 05/25/2013 0921   ALT 15 05/25/2013 0921   ALKPHOS 165* 05/25/2013 0921   BILITOT 0.3 05/25/2013 0921   GFRNONAA 53* 05/25/2013 0921   GFRAA 61 05/25/2013 0921    Lab Results  Component Value Date   HGBA1C 6.8* 05/25/2013    Assessment/plan  Peripheral neuropathy- from dm and long standing vascular complications. Will change his neurontin to 200 mg bid for now and monitor further    Dementia- mixed picture of alzhimer's, vascular and hypothyroidism. Continue aricept. Will discontinue namenda due to cost issue after discussing it with his daughter in law. Monitor for behavior changes. Discussed with family about long term care goals, progression of the disease and him not driving.   Dm type 2- improved on review of a1c. Given his decreased gfr, age and a1c < 7, will decrease metformin to 500 mg bid. urine microalbumin pending. Continue asa, statin, losartan for now  afib- continue diltiazem and toprol xl and ASA. Continue digoxin  htn- bp well controlled in office today. Reviewed home bp readings. Continue current regimen.   Hypothyroidism- continue levothyroxine for now. Monitor tsh periodically. Advised to have him take levothyroxine early morning empty stomach prior to other meds and will reassess tsh next visit

## 2013-06-02 LAB — MICROALBUMIN / CREATININE URINE RATIO
Creatinine, Ur: 115.8 mg/dL (ref 22.0–328.0)
MICROALB/CREAT RATIO: 13.9 mg/g{creat} (ref 0.0–30.0)
Microalbumin, Urine: 16.1 ug/mL (ref 0.0–17.0)

## 2013-06-03 ENCOUNTER — Other Ambulatory Visit: Payer: Self-pay | Admitting: *Deleted

## 2013-06-03 MED ORDER — DONEPEZIL HCL 10 MG PO TABS: 10.0000 mg | ORAL_TABLET | Freq: Every day | ORAL | Status: DC

## 2013-06-03 MED ORDER — MIRTAZAPINE 30 MG PO TABS: 30.0000 mg | ORAL_TABLET | Freq: Every day | ORAL | Status: DC

## 2013-06-17 ENCOUNTER — Other Ambulatory Visit: Payer: Self-pay | Admitting: *Deleted

## 2013-06-17 MED ORDER — LEVOTHYROXINE SODIUM 50 MCG PO TABS: ORAL_TABLET | ORAL | Status: DC

## 2013-07-28 ENCOUNTER — Telehealth: Payer: Self-pay | Admitting: *Deleted

## 2013-07-28 NOTE — Telephone Encounter (Signed)
Hilda Blades, daughter in law, states that patient is angry and shouting and getting more frustrated. Behavioral changes. Wants to know if you can give him something to calm him and make him more controllable. Please Advise.

## 2013-07-28 NOTE — Telephone Encounter (Signed)
Is he eating and drinking fine? Any fever? If yes- might need blood work to rule out infection  If above are fine, can start him on seroquel 12.5 mg bid for now now and reassess.

## 2013-07-29 ENCOUNTER — Other Ambulatory Visit: Payer: Self-pay | Admitting: *Deleted

## 2013-07-29 MED ORDER — QUETIAPINE 12.5 MG HALF TABLET: ORAL_TABLET | ORAL | Status: DC

## 2013-07-29 NOTE — Telephone Encounter (Signed)
Daughter in Sports coach Notified and called in Rx into [employer] [facility]

## 2013-09-07 ENCOUNTER — Encounter: Payer: Self-pay | Admitting: Cardiovascular Disease

## 2013-09-07 ENCOUNTER — Ambulatory Visit (INDEPENDENT_AMBULATORY_CARE_PROVIDER_SITE_OTHER): Payer: Medicare Other | Admitting: Cardiovascular Disease

## 2013-09-07 VITALS — BP 132/60 | HR 59 | Resp 16 | Ht 68.0 in | Wt 189.9 lb

## 2013-09-07 DIAGNOSIS — E785 Hyperlipidemia, unspecified: Secondary | ICD-10-CM

## 2013-09-07 DIAGNOSIS — I2510 Atherosclerotic heart disease of native coronary artery without angina pectoris: Secondary | ICD-10-CM

## 2013-09-07 DIAGNOSIS — I10 Essential (primary) hypertension: Secondary | ICD-10-CM

## 2013-09-07 DIAGNOSIS — I4891 Unspecified atrial fibrillation: Secondary | ICD-10-CM

## 2013-09-07 DIAGNOSIS — I5032 Chronic diastolic (congestive) heart failure: Secondary | ICD-10-CM

## 2013-09-07 NOTE — Assessment & Plan Note (Signed)
Due to have a repeat lipid profile with Dr. Bubba Camp in the next few weeks. . I don't think his lipid profiles been rechecked since we switched him to pravastatin because of the risk of diltiazem-simvastatin interaction.

## 2013-09-07 NOTE — Assessment & Plan Note (Signed)
Have advised that the dose of furosemide be increased to 40 mg daily on days when his weight exceeds 190 pounds.

## 2013-09-07 NOTE — Assessment & Plan Note (Signed)
Satisfactory control.

## 2013-09-07 NOTE — Assessment & Plan Note (Signed)
July 2006 Dr. Servando Snare off-pump CABG x3 (LIMA to LAD, SVG to intermediate, SVG to PDA). Asymptomatic, preserved left ventricular systolic function.

## 2013-09-07 NOTE — Patient Instructions (Signed)
Your physician recommends that you schedule a follow-up appointment in: 6 months  Your physician has recommended you make the following change in your medication: stop Digoxin, increase Furosamide to 40 mg if weight goes above 190lbs

## 2013-09-07 NOTE — Progress Notes (Signed)
Patient ID: Nathaniel Lopez, male   DOB: [birth year], 80 y.o.   MRN: [id number]     Reason for office visit Followup CAD status post CABG, permanent atrial fibrillation, diastolic heart failure.  Nathaniel Lopez is accompanied today by his daughter, who works at [employer]. There have not been any major changes in his health. He spent several days of the week living with his daughter and while there his weight tends to be very steady. When he goes home for a few days she often will put on as much as 6 or 7 pounds and his daughter believes it is because he is drinking lots of fluid. She can tell by how quickly he goes through the milk cartons and bottles of water. She has had to occasionally give him a higher dose of diuretic to compensate for this. She does not think that he is getting extra salt. When his weight is up she has some ankle edema and she has noticed him to be more dyspneic, although Nathaniel Lopez denies any problems with shortness of breath. His optimum weight seems to be around 185-186 pounds. He has not had angina pectoris.  Remains unsteady on his feet and has stumbled against a wall or against furniture a couple of times although he has not had any serious falls.  He had coronary bypass surgery in 2006 and has not required cardiac catheterization or had any symptoms of angina pectoris since that time. He has chronic total occlusion of the LAD artery and right coronary artery and a 90% stenosis in the ramus intermedius artery which were treated with a left internal mammary artery and 2 saphenous vein graft bypasses, respectively. Left circumflex system was patent and did not require bypass. He has normal left ventricular systolic function by echocardiography performed July 2014. He does not have significant valvular abnormalities but has biatrial dilatation and permanent atrial fibrillation. He has radiographic evidence of an old basal ganglia ischemic stroke but no recent neurological  complaints. He is not on chronic anticoagulation since he is felt to be at high risk for bleeding.  His dementia has progressed and he is sometimes frustrated and angry. For this reason treatment with several "was started. This has helped.    Allergies  Allergen Reactions  . Ace Inhibitors Cough  . Peanut-Containing Drug Products     REACTION: cough   Followup diastolic heart failure Current Outpatient Prescriptions  Medication Sig Dispense Refill  . aspirin 325 MG tablet Take 325 mg by mouth daily.        . digoxin (LANOXIN) 0.125 MG tablet Take 1 tablet (125 mcg total) by mouth daily.  30 tablet  4  . diltiazem (CARDIZEM CD) 240 MG 24 hr capsule Take 1 capsule (240 mg total) by mouth daily.  30 capsule  8  . donepezil (ARICEPT) 10 MG tablet Take 1 tablet (10 mg total) by mouth at bedtime.  30 tablet  3  . furosemide (LASIX) 20 MG tablet Take 20 mg by mouth daily.      Marland Kitchen gabapentin (NEURONTIN) 100 MG capsule Take 2 capsule twice a day for pain in your feet  120 capsule  3  . levothyroxine (SYNTHROID, LEVOTHROID) 50 MCG tablet Take one tablet by mouth once daily.  30 tablet  5  . losartan (COZAAR) 50 MG tablet Take 1 tablet (50 mg total) by mouth daily.  30 tablet  11  . Melatonin 5 MG TABS Take 5 mg by mouth 2 (two) times daily. Take  one tablet once daily      . metFORMIN (GLUCOPHAGE) 500 MG tablet Take one tablet twice a day for diabetes  60 tablet  5  . metoprolol succinate (TOPROL-XL) 25 MG 24 hr tablet Take 25 mg by mouth daily.      . mirtazapine (REMERON) 30 MG tablet Take 1 tablet (30 mg total) by mouth at bedtime.  30 tablet  3  . niacin (NIASPAN) 1000 MG CR tablet Take 500 mg by mouth daily.       . nitroGLYCERIN (NITROSTAT) 0.4 MG SL tablet Place 1 tablet (0.4 mg total) under the tongue every 5 (five) minutes as needed.  30 tablet  3  . pravastatin (PRAVACHOL) 40 MG tablet Take 1 tablet (40 mg total) by mouth every evening.  90 tablet  3  . QUEtiapine (SEROQUEL) 12.5 mg TABS  tablet Take one tablet by mouth twice daily  60 tablet  1  . Tamsulosin HCl (FLOMAX) 0.4 MG CAPS Take 0.4 mg by mouth. 2 by mouth once daily      . vitamin B-12 (CYANOCOBALAMIN) 1000 MCG tablet Take 1,000 mcg by mouth daily.        No current facility-administered medications for this visit.    Past Medical History  Diagnosis Date  . CHF (congestive heart failure)     diastolic dysfunction  . Atrial fibrillation     permanent  . Colon cancer   . Carotid arterial disease   . COPD (chronic obstructive pulmonary disease)     moderate 2006 by PFT FEV1 1.39  . Hyperlipidemia   . Type 2 diabetes mellitus   . Hypertension   . Hypothyroid   . Pancreatic cyst 12/14/2011    MRI - stable cyst lesion, likely benign  . Myocardial infarct   . Coronary artery disease   . Prostatitis   . Gallstones   . TIA (transient ischemic attack)   . Tremor   . Carotid bruit 12/17/2010    doppler - ICAs normal patency w/o evidence of significant diameter reduction/dissection; R ECA turbulent flowthroughout, possible source of bruit  . Cerebral atherosclerosis 11/10/2009    doppler - R bulb and proximal ICA 50-69% diameter reduction; R ECA high grade stenosis; L ICA 0-49% diameter reduction  . Carotid bruit 10/21/2006    doppler - R bulb and proximal ICA 50-69% diameter reduction, R ECA high grade stenosis; L ICA 0-49% diameter reduction  . Dementia   . Incomplete bladder emptying   . Acute pyelonephritis without lesion of renal medullary necrosis   . Full incontinence of feces   . Other specified disease of pancreas   . Unspecified disorder of prostate   . Herpes zoster without mention of complication   . Pain in joint, pelvic region and thigh   . Other vitamin B12 deficiency anemia   . Vascular dementia, uncomplicated   . Muscle weakness (generalized)   . Other specified disorder of skin   . Depression   . Coronary atherosclerosis of unspecified type of vessel, native or graft     Past Surgical  History  Procedure Laterality Date  . Hernia repair  2003  . Cholecystectomy  1990  . Coronary artery bypass graft  2006    LIMA to LAD; RSV to ICA; RSV to posterior descending CA  . Colon surgery  1989  . Cataract extraction Right 2006  . Tee with cardioversion  04/16/2010    EF 55-60%; LA mod dialted; AF precludes elval of LV diastolic fcn;  septal motion consistent w/ post-thoracotomy state  . Cardiac catheterization  12/03/2004    total occulsion of LAD w/ retrograde filing, total occlusion RCA w/ l to r collaterals, 90% proximal optional diagonal disease; normal LV systolic fcn, no renal artery stenosis;   . Colonoscopy  01/27/2008    Dr.Magod, polyps  . Colonoscopy  03/02/2009    Dr.Magod, adenomatous and hyperplastic polyps no malignancy     Family History  Problem Relation Age of Onset  . Heart attack Mother   . Breast cancer Mother   . Heart disease Mother 93    myocardiomyopathy  . Alzheimer's disease Sister     History   Social History  . Marital Status: Widowed    Spouse Name: N/A    Number of Children: N/A  . Years of Education: N/A   Occupational History  . 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 Smoking status: Former Smoker -- 2.00 packs/day for 20 years    Types: Cigarettes    Quit date: 07/01/1968  . Smokeless tobacco: Never Used  . Alcohol Use: 0.6 oz/week    1 Cans of beer per week     Comment: social  . Drug Use: No  . Sexual Activity: Not on file   Other Topics Concern  . Not on file   Social History Narrative   Pt is Jehovah's Witness   MMSE completed 07/29/2011 (25)   Last Annual Exam 07/29/2011 ([facility])    Review of systems: The patient specifically denies any chest pain at rest or with exertion, dyspnea at rest or with exertion, orthopnea, paroxysmal nocturnal dyspnea, syncope, palpitations, focal neurological deficits, intermittent claudication, lower extremity edema, unexplained weight gain, cough, hemoptysis  or wheezing.  The patient also denies abdominal pain, nausea, vomiting, dysphagia, diarrhea, constipation, polyuria, polydipsia, dysuria, hematuria, frequency, urgency, abnormal bleeding or bruising, fever, chills, unexpected weight changes, mood swings, change in skin or hair texture, change in voice quality, auditory or visual problems, allergic reactions or rashes, new musculoskeletal complaints other than usual "aches and pains".   PHYSICAL EXAM BP 132/60  Pulse 59  Ht 5\' 8"  (1.727 m)  Wt 86.138 kg (189 lb 14.4 oz)  BMI 28.88 kg/m2 General: Alert, oriented x3, no distress  Head: no evidence of trauma, PERRL, EOMI, no exophtalmos or lid lag, no myxedema, no xanthelasma; normal ears, nose and oropharynx  Neck: normal jugular venous pulsations and no hepatojugular reflux; brisk carotid pulses without delay and no carotid bruits  Chest: clear to auscultation, no signs of consolidation by percussion or palpation, normal fremitus, symmetrical and full respiratory excursions; sternotomy scar  Cardiovascular: normal position and quality of the apical impulse, irregular rhythm, normal first and second heart sounds, no murmurs, rubs or gallops  Abdomen: no tenderness or distention, no masses by palpation, no abnormal pulsatility or arterial bruits, normal bowel sounds, no hepatosplenomegaly  Extremities: no clubbing, cyanosis or edema; 2+ radial, ulnar and brachial pulses bilaterally; 2+ right femoral, posterior tibial and dorsalis pedis pulses; 2+ left femoral, posterior tibial and dorsalis pedis pulses; no subclavian or femoral bruits, healed right saphenectomy scars the right ankle is a little puffy but not overtly swollen2  Neurological: grossly nonfocal   EKG: Atrial fibrillation, otherwise normal  Lipid Panel     Component Value Date/Time   TRIG 253* 11/26/2012 0852   HDL 24* 11/26/2012 0852   CHOLHDL 9.0* 11/26/2012 0852   LDLCALC 140* 11/26/2012 0852    BMET  Component Value  Date/Time   NA 143 05/25/2013 0921   NA 141 02/26/2013 1130   K 4.5 05/25/2013 0921   CL 100 05/25/2013 0921   CO2 24 05/25/2013 0921   GLUCOSE 128* 05/25/2013 0921   GLUCOSE 94 02/26/2013 1130   BUN 22 05/25/2013 0921   BUN 18 02/26/2013 1130   CREATININE 1.25 05/25/2013 0921   CREATININE 1.08 02/26/2013 1130   CALCIUM 10.1 05/25/2013 0921   GFRNONAA 53* 05/25/2013 0921   GFRAA 61 05/25/2013 0921     ASSESSMENT AND PLAN ATRIAL FIBRILLATION Excellent rate control. I have advised that he discontinue the digoxin since it does not appear to be necessary for sufficient rate control and since it has a narrow therapeutic index. He is on aspirin for stroke prophylaxis due to the high risk of falls.  CAD (coronary artery disease) s/p CABG 2006 July 2006 Dr. Servando Snare off-pump CABG x3 (LIMA to LAD, SVG to intermediate, SVG to PDA). Asymptomatic, preserved left ventricular systolic function.  Chronic diastolic heart failure Have advised that the dose of furosemide be increased to 40 mg daily on days when his weight exceeds 190 pounds.  HYPERLIPIDEMIA Due to have a repeat lipid profile with Dr. Bubba Camp in the next few weeks. . I don't think his lipid profiles been rechecked since we switched him to pravastatin because of the risk of diltiazem-simvastatin interaction.  HYPERTENSION Satisfactory control.   Patient Instructions  Your physician recommends that you schedule a follow-up appointment in: 6 months  Your physician has recommended you make the following change in your medication: stop Digoxin, increase Furosamide to 40 mg if weight goes above 190lbs       Orders Placed This Encounter  Procedures  . EKG 12-Lead   Raheem Kolbe  Sanda Klein, MD, [facility] [phone] office [phone] pager

## 2013-09-07 NOTE — Assessment & Plan Note (Signed)
Excellent rate control. I have advised that he discontinue the digoxin since it does not appear to be necessary for sufficient rate control and since it has a narrow therapeutic index. He is on aspirin for stroke prophylaxis due to the high risk of falls.

## 2013-09-13 ENCOUNTER — Encounter: Payer: Self-pay | Admitting: *Deleted

## 2013-09-14 ENCOUNTER — Ambulatory Visit (INDEPENDENT_AMBULATORY_CARE_PROVIDER_SITE_OTHER): Payer: Medicare Other | Admitting: Internal Medicine

## 2013-09-14 ENCOUNTER — Encounter: Payer: Self-pay | Admitting: Internal Medicine

## 2013-09-14 VITALS — BP 112/58 | HR 50 | Temp 97.5°F | Resp 14 | Wt 186.8 lb

## 2013-09-14 DIAGNOSIS — E1142 Type 2 diabetes mellitus with diabetic polyneuropathy: Secondary | ICD-10-CM

## 2013-09-14 DIAGNOSIS — I4891 Unspecified atrial fibrillation: Secondary | ICD-10-CM

## 2013-09-14 DIAGNOSIS — F0281 Dementia in other diseases classified elsewhere with behavioral disturbance: Secondary | ICD-10-CM

## 2013-09-14 DIAGNOSIS — E1149 Type 2 diabetes mellitus with other diabetic neurological complication: Secondary | ICD-10-CM

## 2013-09-14 DIAGNOSIS — F02818 Dementia in other diseases classified elsewhere, unspecified severity, with other behavioral disturbance: Secondary | ICD-10-CM

## 2013-09-14 DIAGNOSIS — E039 Hypothyroidism, unspecified: Secondary | ICD-10-CM

## 2013-09-14 DIAGNOSIS — F0280 Dementia in other diseases classified elsewhere without behavioral disturbance: Secondary | ICD-10-CM

## 2013-09-14 DIAGNOSIS — I498 Other specified cardiac arrhythmias: Secondary | ICD-10-CM

## 2013-09-14 DIAGNOSIS — G309 Alzheimer's disease, unspecified: Secondary | ICD-10-CM

## 2013-09-14 DIAGNOSIS — I10 Essential (primary) hypertension: Secondary | ICD-10-CM

## 2013-09-14 DIAGNOSIS — R001 Bradycardia, unspecified: Secondary | ICD-10-CM

## 2013-09-14 MED ORDER — TETANUS-DIPHTH-ACELL PERTUSSIS 5-2.5-18.5 LF-MCG/0.5 IM SUSP: 0.5000 mL | Freq: Once | INTRAMUSCULAR | Status: DC

## 2013-09-14 MED ORDER — DILTIAZEM HCL ER COATED BEADS 240 MG PO CP24: 240.0000 mg | ORAL_CAPSULE | Freq: Every day | ORAL | Status: DC

## 2013-09-14 NOTE — Patient Instructions (Signed)
Stop metoprolol from today. Monitor your blood pressure and heart rate at home. If your dizziness persists, please notify us.

## 2013-09-14 NOTE — Progress Notes (Signed)
Patient ID: Nathaniel Lopez, male   DOB: [birth year], 79 y.o.   MRN: [id number]    Allergies  Allergen Reactions  . Ace Inhibitors Cough  . Peanut-Containing Drug Products     REACTION: cough   Chief complaint- routine follow up  HPI 80 y/o male pt here for RV with his daughter. He had episode of lightheadedness last evening and was tired. He has hx of afib, CAD, htn and dm along with dementia. seroquel has helped with his agitation and gabapentin with his nerve pain Reviewed recent cardiology note Reviewed home bp and cbg readings  Reviewed bp readings for this month from home. SBP 109-143, DBP 44-86 Heart rate 50-80 with one in 46/min cbg 102-153 mostly 130s He is hard of hearing and has dementia making ROS dificult  Review of Systems  Constitutional: Positive for fatigue. Negative for fever, chills, diaphoresis, activity change and appetite change.  HENT: Positive for hearing loss. Negative for ear pain, congestion, facial swelling, mouth sores and tinnitus.   Eyes: Negative for visual disturbance.  Respiratory: Negative for cough, dyspnea and chest tightness.   Cardiovascular: Negative for chest pain, palpitations and leg swelling.  Gastrointestinal: Negative for abdominal pain and constipation.  Genitourinary: Negative for dysuria.  Musculoskeletal: Negative for joint swelling.  Skin: Negative for rash.  Neurological: Negative for dizziness, tremors and light-headedness.  Hematological: Negative for adenopathy.  Psychiatric/Behavioral: Positive for dementia. Negative for behavioral problems and agitation.   Past Medical History  Diagnosis Date  . CHF (congestive heart failure)     diastolic dysfunction  . Atrial fibrillation     permanent  . Colon cancer   . Carotid arterial disease   . COPD (chronic obstructive pulmonary disease)     moderate 2006 by PFT FEV1 1.39  . Hyperlipidemia   . Type 2 diabetes mellitus   . Hypertension   . Hypothyroid   . Pancreatic cyst  12/14/2011    MRI - stable cyst lesion, likely benign  . Myocardial infarct   . Coronary artery disease   . Prostatitis   . Gallstones   . TIA (transient ischemic attack)   . Tremor   . Carotid bruit 12/17/2010    doppler - ICAs normal patency w/o evidence of significant diameter reduction/dissection; R ECA turbulent flowthroughout, possible source of bruit  . Cerebral atherosclerosis 11/10/2009    doppler - R bulb and proximal ICA 50-69% diameter reduction; R ECA high grade stenosis; L ICA 0-49% diameter reduction  . Carotid bruit 10/21/2006    doppler - R bulb and proximal ICA 50-69% diameter reduction, R ECA high grade stenosis; L ICA 0-49% diameter reduction  . Dementia   . Incomplete bladder emptying   . Acute pyelonephritis without lesion of renal medullary necrosis   . Full incontinence of feces   . Other specified disease of pancreas   . Unspecified disorder of prostate   . Herpes zoster without mention of complication   . Pain in joint, pelvic region and thigh   . Other vitamin B12 deficiency anemia   . Vascular dementia, uncomplicated   . Muscle weakness (generalized)   . Other specified disorder of skin   . Depression   . Coronary atherosclerosis of unspecified type of vessel, native or graft    Past Surgical History  Procedure Laterality Date  . Hernia repair  2003  . Cholecystectomy  1990  . Coronary artery bypass graft  2006    LIMA to LAD; RSV to ICA; RSV to posterior descending  CA  . Colon surgery  1989  . Cataract extraction Right 2006  . Tee with cardioversion  04/16/2010    EF 55-60%; LA mod dialted; AF precludes elval of LV diastolic fcn; septal motion consistent w/ post-thoracotomy state  . Cardiac catheterization  12/03/2004    total occulsion of LAD w/ retrograde filing, total occlusion RCA w/ l to r collaterals, 90% proximal optional diagonal disease; normal LV systolic fcn, no renal artery stenosis;   . Colonoscopy  01/27/2008    Dr.Magod, polyps  .  Colonoscopy  03/02/2009    Dr.Magod, adenomatous and hyperplastic polyps no malignancy    Current Outpatient Prescriptions on File Prior to Visit  Medication Sig Dispense Refill  . aspirin 325 MG tablet Take 325 mg by mouth daily.        Marland Kitchen donepezil (ARICEPT) 10 MG tablet Take 1 tablet (10 mg total) by mouth at bedtime.  30 tablet  3  . furosemide (LASIX) 20 MG tablet Take 20 mg by mouth daily.      Marland Kitchen gabapentin (NEURONTIN) 100 MG capsule Take 2 capsule twice a day for pain in your feet  120 capsule  3  . levothyroxine (SYNTHROID, LEVOTHROID) 50 MCG tablet Take one tablet by mouth once daily.  30 tablet  5  . losartan (COZAAR) 50 MG tablet Take 1 tablet (50 mg total) by mouth daily.  30 tablet  11  . Melatonin 5 MG TABS Take 5 mg by mouth 2 (two) times daily. Take one tablet once daily      . metFORMIN (GLUCOPHAGE) 500 MG tablet Take one tablet twice a day for diabetes  60 tablet  5  . mirtazapine (REMERON) 30 MG tablet Take 1 tablet (30 mg total) by mouth at bedtime.  30 tablet  3  . niacin (NIASPAN) 1000 MG CR tablet Take 500 mg by mouth daily.       . nitroGLYCERIN (NITROSTAT) 0.4 MG SL tablet Place 1 tablet (0.4 mg total) under the tongue every 5 (five) minutes as needed.  30 tablet  3  . pravastatin (PRAVACHOL) 40 MG tablet Take 1 tablet (40 mg total) by mouth every evening.  90 tablet  3  . QUEtiapine (SEROQUEL) 12.5 mg TABS tablet Take one tablet by mouth twice daily  60 tablet  1  . Tamsulosin HCl (FLOMAX) 0.4 MG CAPS Take 0.4 mg by mouth. 2 by mouth once daily      . vitamin B-12 (CYANOCOBALAMIN) 1000 MCG tablet Take 1,000 mcg by mouth daily.       . digoxin (LANOXIN) 0.125 MG tablet Take 1 tablet (125 mcg total) by mouth daily.  30 tablet  4   No current facility-administered medications on file prior to visit.   Physical exam BP 112/58  Pulse 50  Temp(Src) 97.5 F (36.4 C) (Oral)  Resp 14  Wt 186 lb 12.8 oz (84.732 kg)  SpO2 99%  Constitutional: He appears well-developed  and well-nourished. No distress.   HENT:   Head: Normocephalic and atraumatic.   Mouth/Throat: Oropharynx is clear and moist.   Eyes: Conjunctivae are normal. Pupils are equal, round, and reactive to light.   Neck: Normal range of motion. Neck supple. No JVD present. No tracheal deviation present. No thyromegaly present.   Cardiovascular: bradycardia, no murmur appreciated Pulmonary/Chest: Effort normal and breath sounds normal. No respiratory distress. He has no wheezes.  Sternotomy scar present   Abdominal: Soft. Bowel sounds are normal. There is no tenderness.  Musculoskeletal: Normal  range of motion. He exhibits no edema.  Uses a cane and has occassional involuntary jerks in both legs and arms Neurological: He is alert. No cranial nerve deficit.  Oriented to person and place   Skin: Skin is warm and dry.  Psychiatric: He has a normal mood and affect.    Labs- Lab Results  Component Value Date   TSH 4.960* 05/25/2013   Lab Results  Component Value Date   HGBA1C 6.8* 05/25/2013   CBC    Component Value Date/Time   WBC 7.8 05/25/2013 0921   WBC 4.8 07/22/2012 0915   RBC 4.72 05/25/2013 0921   RBC 4.00* 07/22/2012 0915   HGB 14.4 05/25/2013 0921   HCT 42.7 05/25/2013 0921   PLT 246 07/22/2012 0915   MCV 91 05/25/2013 0921   MCH 30.5 05/25/2013 0921   MCH 31.0 07/22/2012 0915   MCHC 33.7 05/25/2013 0921   MCHC 33.7 07/22/2012 0915   RDW 14.3 05/25/2013 0921   RDW 14.4 07/22/2012 0915   LYMPHSABS 2.2 05/25/2013 0921   LYMPHSABS 1.1 07/22/2012 0915   MONOABS 0.6 07/22/2012 0915   EOSABS 0.1 05/25/2013 0921   EOSABS 0.1 07/22/2012 0915   BASOSABS 0.0 05/25/2013 0921   BASOSABS 0.0 07/22/2012 0915    CMP     Component Value Date/Time   NA 143 05/25/2013 0921   NA 141 02/26/2013 1130   K 4.5 05/25/2013 0921   CL 100 05/25/2013 0921   CO2 24 05/25/2013 0921   GLUCOSE 128* 05/25/2013 0921   GLUCOSE 94 02/26/2013 1130   BUN 22 05/25/2013 0921   BUN 18 02/26/2013 1130    CREATININE 1.25 05/25/2013 0921   CREATININE 1.08 02/26/2013 1130   CALCIUM 10.1 05/25/2013 0921   PROT 7.2 05/25/2013 0921   PROT 6.8 07/22/2012 0915   ALBUMIN 3.3* 07/22/2012 0915   AST 14 05/25/2013 0921   ALT 15 05/25/2013 0921   ALKPHOS 165* 05/25/2013 0921   BILITOT 0.3 05/25/2013 0921   GFRNONAA 53* 05/25/2013 0921   GFRAA 61 05/25/2013 0921    Assessment/plan  1. HYPERTENSION bp from home reviewed, mostly in normal range, few with low DBP readings. With slow heart rate, dizziness and low DBP, will stop his metoprolol for now and reassess further. Check bp at home  2. Atrial fibrillation Rate controlled. Reviewed cardiology note. Off digoxin. Will low heart rate, will d/c metoprolol for now. Continue diltiazem and aspirin  3. Bradycardia Likely iatrogenic. Will d/c metoprolol. Monitor heart rate. If remains low, consider decreasing dose of cardizem and EKG to rule out AV block  4. HYPOTHYROIDISM Recheck tsh. Continue levothyroxine - TSH  5. DM type 2 with diabetic peripheral neuropathy Recheck a1c. Reviewed home cbg. Continue metformin for now. Continue gabapentin which has been helpful. Encouraged to use walker in place of cane. - Hemoglobin A1c - CMP  6. Alzheimer's disease Persists. Decline anticipated. Continue aricept with remeron and seroquel for behavior changes

## 2013-09-15 LAB — COMPREHENSIVE METABOLIC PANEL
ALBUMIN: 4.4 g/dL (ref 3.5–4.7)
ALK PHOS: 145 IU/L — AB (ref 39–117)
ALT: 19 IU/L (ref 0–44)
AST: 15 IU/L (ref 0–40)
Albumin/Globulin Ratio: 2 (ref 1.1–2.5)
BILIRUBIN TOTAL: 0.3 mg/dL (ref 0.0–1.2)
BUN/Creatinine Ratio: 23 — ABNORMAL HIGH (ref 10–22)
BUN: 32 mg/dL — AB (ref 8–27)
CHLORIDE: 99 mmol/L (ref 97–108)
CO2: 24 mmol/L (ref 18–29)
Calcium: 9.7 mg/dL (ref 8.6–10.2)
Creatinine, Ser: 1.4 mg/dL — ABNORMAL HIGH (ref 0.76–1.27)
GFR calc non Af Amer: 46 mL/min/{1.73_m2} — ABNORMAL LOW (ref >59)
GFR, EST AFRICAN AMERICAN: 53 mL/min/{1.73_m2} — AB (ref >59)
GLOBULIN, TOTAL: 2.2 g/dL (ref 1.5–4.5)
GLUCOSE: 78 mg/dL (ref 65–99)
Potassium: 4.4 mmol/L (ref 3.5–5.2)
Sodium: 142 mmol/L (ref 134–144)
TOTAL PROTEIN: 6.6 g/dL (ref 6.0–8.5)

## 2013-09-15 LAB — HEMOGLOBIN A1C
Est. average glucose Bld gHb Est-mCnc: 157 mg/dL
Hgb A1c MFr Bld: 7.1 % — ABNORMAL HIGH (ref 4.8–5.6)

## 2013-09-15 LAB — TSH: TSH: 4.08 u[IU]/mL (ref 0.450–4.500)

## 2013-09-20 ENCOUNTER — Telehealth: Payer: Self-pay

## 2013-09-20 NOTE — Telephone Encounter (Signed)
Left message on voicemail for patient to return call when available   

## 2013-09-20 NOTE — Telephone Encounter (Signed)
Message copied by Logan Bores on Mon Sep 20, 2013  1:23 PM ------      Message from: [name]      Created: Mon Sep 20, 2013 11:22 AM       Let's continue metformin at 500 mg bid only for now given his age, worsening kidney function and risks of hypoglycemia. If kidney function continues to worsen, i will be stopping the metformin all together and switch him to another hypoglycemic agent. His goal a1c is < 8 and he is at that goal at present for his blood sugar.  With his thyroid level, it is improved since last visit but given his memory issues and low energy level, I would like to keep TSH below 4. This might help improve his energy level.Thus, lets increase the levothyroxine to 75 mcg daily. ------

## 2013-09-22 ENCOUNTER — Other Ambulatory Visit: Payer: Self-pay | Admitting: *Deleted

## 2013-09-22 ENCOUNTER — Telehealth: Payer: Self-pay | Admitting: *Deleted

## 2013-09-22 MED ORDER — LEVOTHYROXINE SODIUM 75 MCG PO TABS: 75.0000 ug | ORAL_TABLET | Freq: Every day | ORAL | Status: DC

## 2013-09-22 NOTE — Telephone Encounter (Signed)
Blood pressure looks good. To continue holding metoprolol for now. ch4eck bp every other day and will review in next office visit. Blood sugar reading is good. Continue metgormin at current dose.

## 2013-09-22 NOTE — Telephone Encounter (Signed)
Nathaniel Lopez (other Nathaniel Lopez) spoke with patient's daughter and further discussed. Per Nathaniel Lopez was sent for increased dose of thyroid medication

## 2013-09-22 NOTE — Telephone Encounter (Signed)
Pt's daughter, Hilda Blades, his last BP/Pulse/weight/BS readings are as follows:  Date       BP           Pulse         Weight  BS 3/20 141/76  73  182  124 3/23 133/64  79  184  120 3/24 136/90  83  184  98  3/24 129/81  102      3/24 130/84  84 3/25 142/85  98  Please advise if any changes in medications need to be done. Thanks

## 2013-09-23 NOTE — Telephone Encounter (Signed)
Pt notified VIA phone regarding recommendations.

## 2013-09-29 ENCOUNTER — Other Ambulatory Visit: Payer: Self-pay | Admitting: *Deleted

## 2013-09-29 MED ORDER — QUETIAPINE 12.5 MG HALF TABLET: ORAL_TABLET | ORAL | Status: DC

## 2013-09-29 NOTE — Telephone Encounter (Signed)
[facility] 

## 2013-10-27 ENCOUNTER — Other Ambulatory Visit: Payer: Self-pay | Admitting: *Deleted

## 2013-10-27 MED ORDER — MIRTAZAPINE 30 MG PO TABS: 30.0000 mg | ORAL_TABLET | Freq: Every day | ORAL | Status: DC

## 2013-10-27 NOTE — Telephone Encounter (Signed)
[facility] 

## 2013-11-09 ENCOUNTER — Other Ambulatory Visit: Payer: Self-pay | Admitting: *Deleted

## 2013-11-09 MED ORDER — DONEPEZIL HCL 10 MG PO TABS: 10.0000 mg | ORAL_TABLET | Freq: Every day | ORAL | Status: DC

## 2013-12-14 ENCOUNTER — Ambulatory Visit: Payer: Medicare Other | Admitting: Internal Medicine

## 2013-12-15 ENCOUNTER — Other Ambulatory Visit: Payer: Self-pay | Admitting: Internal Medicine

## 2013-12-30 ENCOUNTER — Other Ambulatory Visit: Payer: Self-pay | Admitting: Internal Medicine

## 2014-01-25 ENCOUNTER — Ambulatory Visit (INDEPENDENT_AMBULATORY_CARE_PROVIDER_SITE_OTHER): Payer: Medicare Other | Admitting: Internal Medicine

## 2014-01-25 ENCOUNTER — Encounter: Payer: Self-pay | Admitting: Internal Medicine

## 2014-01-25 VITALS — BP 126/64 | HR 69 | Temp 97.7°F | Wt 183.2 lb

## 2014-01-25 DIAGNOSIS — E039 Hypothyroidism, unspecified: Secondary | ICD-10-CM

## 2014-01-25 DIAGNOSIS — F0280 Dementia in other diseases classified elsewhere without behavioral disturbance: Secondary | ICD-10-CM

## 2014-01-25 DIAGNOSIS — I10 Essential (primary) hypertension: Secondary | ICD-10-CM

## 2014-01-25 DIAGNOSIS — G5790 Unspecified mononeuropathy of unspecified lower limb: Secondary | ICD-10-CM

## 2014-01-25 DIAGNOSIS — H6123 Impacted cerumen, bilateral: Secondary | ICD-10-CM

## 2014-01-25 DIAGNOSIS — G309 Alzheimer's disease, unspecified: Secondary | ICD-10-CM

## 2014-01-25 DIAGNOSIS — R632 Polyphagia: Secondary | ICD-10-CM

## 2014-01-25 DIAGNOSIS — E785 Hyperlipidemia, unspecified: Secondary | ICD-10-CM

## 2014-01-25 DIAGNOSIS — F02818 Dementia in other diseases classified elsewhere, unspecified severity, with other behavioral disturbance: Secondary | ICD-10-CM

## 2014-01-25 DIAGNOSIS — E119 Type 2 diabetes mellitus without complications: Secondary | ICD-10-CM

## 2014-01-25 DIAGNOSIS — H6120 Impacted cerumen, unspecified ear: Secondary | ICD-10-CM | POA: Insufficient documentation

## 2014-01-25 DIAGNOSIS — M792 Neuralgia and neuritis, unspecified: Secondary | ICD-10-CM

## 2014-01-25 DIAGNOSIS — F0281 Dementia in other diseases classified elsewhere with behavioral disturbance: Secondary | ICD-10-CM | POA: Insufficient documentation

## 2014-01-25 MED ORDER — MIRTAZAPINE 30 MG PO TABS: 30.0000 mg | ORAL_TABLET | Freq: Every day | ORAL | Status: DC

## 2014-01-25 MED ORDER — GLUCOSE BLOOD VI STRP: ORAL_STRIP | Status: DC

## 2014-01-25 MED ORDER — DILTIAZEM HCL ER COATED BEADS 240 MG PO CP24: 240.0000 mg | ORAL_CAPSULE | Freq: Every day | ORAL | Status: DC

## 2014-01-25 MED ORDER — QUETIAPINE FUMARATE 25 MG PO TABS: ORAL_TABLET | ORAL | Status: DC

## 2014-01-25 MED ORDER — CARBAMIDE PEROXIDE 6.5 % OT SOLN: 5.0000 [drp] | Freq: Two times a day (BID) | OTIC | Status: DC

## 2014-01-25 MED ORDER — DONEPEZIL HCL 10 MG PO TABS: 10.0000 mg | ORAL_TABLET | Freq: Every day | ORAL | Status: DC

## 2014-01-25 NOTE — Progress Notes (Signed)
Patient ID: Nathaniel Lopez, male   DOB: [birth year], 80 y.o.   MRN: [id number]    Chief Complaint  Patient presents with  . Follow-up    3 month f/u with no prior  . other    some tremors when he is at rest 2-3 days & wants ears washed out   Allergies  Allergen Reactions  . Ace Inhibitors Cough  . Peanut-Containing Drug Products     REACTION: cough   HPI 80 y/o male pt here for RV with his daughter in law. He has been having increased appetite lately for past 2 months. His ears feel stuffed up. He has frequent episodes of agitation lately and daughter in law would like to know if meds can be adjusted. He has hx of afib, CAD, htn and dm along with dementia. seroquel has helped with his agitation and gabapentin with his nerve pain Reviewed bp readings for this month from home. SBP 118-128 cbg 100-120s He is hard of hearing and has dementia making ROS dificult  Review of Systems   Constitutional: Positive for fatigue. Negative for fever, chills, diaphoresis, activity change  HENT: Positive for hearing loss. Negative for ear pain, congestion, facial swelling, mouth sores and tinnitus.    Eyes: Negative for visual disturbance.   Respiratory: Negative for cough, dyspnea and chest tightness.    Cardiovascular: Negative for chest pain, palpitations and leg swelling.   Gastrointestinal: Negative for abdominal pain and constipation.   Genitourinary: Negative for dysuria.   Musculoskeletal: Negative for joint swelling.   Skin: Negative for rash.   Neurological: Negative for dizziness, tremors and light-headedness. Has balance problem, using cane Hematological: Negative for adenopathy.   Psychiatric/Behavioral: Positive for dementia.   Past Medical History  Diagnosis Date  . CHF (congestive heart failure)     diastolic dysfunction  . Atrial fibrillation     permanent  . Colon cancer   . Carotid arterial disease   . COPD (chronic obstructive pulmonary disease)     moderate 2006 by PFT  FEV1 1.39  . Hyperlipidemia   . Type 2 diabetes mellitus   . Hypertension   . Hypothyroid   . Pancreatic cyst 12/14/2011    MRI - stable cyst lesion, likely benign  . Myocardial infarct   . Coronary artery disease   . Prostatitis   . Gallstones   . TIA (transient ischemic attack)   . Tremor   . Carotid bruit 12/17/2010    doppler - ICAs normal patency w/o evidence of significant diameter reduction/dissection; R ECA turbulent flowthroughout, possible source of bruit  . Cerebral atherosclerosis 11/10/2009    doppler - R bulb and proximal ICA 50-69% diameter reduction; R ECA high grade stenosis; L ICA 0-49% diameter reduction  . Carotid bruit 10/21/2006    doppler - R bulb and proximal ICA 50-69% diameter reduction, R ECA high grade stenosis; L ICA 0-49% diameter reduction  . Dementia   . Incomplete bladder emptying   . Acute pyelonephritis without lesion of renal medullary necrosis   . Full incontinence of feces   . Other specified disease of pancreas   . Unspecified disorder of prostate   . Herpes zoster without mention of complication   . Pain in joint, pelvic region and thigh   . Other vitamin B12 deficiency anemia   . Vascular dementia, uncomplicated   . Muscle weakness (generalized)   . Other specified disorder of skin   . Depression   . Coronary atherosclerosis of unspecified type of vessel,  native or graft    Current Outpatient Prescriptions on File Prior to Visit  Medication Sig Dispense Refill  . aspirin 325 MG tablet Take 325 mg by mouth daily.        . furosemide (LASIX) 20 MG tablet Take 20 mg by mouth daily.      Marland Kitchen gabapentin (NEURONTIN) 100 MG capsule TAKE TWO CAPSULES TWICE DAILY FOR PAIN IN FEET  120 capsule  3  . levothyroxine (SYNTHROID, LEVOTHROID) 75 MCG tablet TAKE ONE TABLET BY MOUTH ONCE DAILY BEFORE BREAKFAST  30 tablet  5  . losartan (COZAAR) 50 MG tablet Take 1 tablet (50 mg total) by mouth daily.  30 tablet  11  . Melatonin 5 MG TABS Take 5 mg by mouth 2  (two) times daily. Take one tablet once daily      . metFORMIN (GLUCOPHAGE) 500 MG tablet TAKE ONE TABLET TWICE DAILY FOR DIABETES  60 tablet  3  . niacin (NIASPAN) 1000 MG CR tablet Take 500 mg by mouth daily.       . nitroGLYCERIN (NITROSTAT) 0.4 MG SL tablet Place 1 tablet (0.4 mg total) under the tongue every 5 (five) minutes as needed.  30 tablet  3  . pravastatin (PRAVACHOL) 40 MG tablet Take 1 tablet (40 mg total) by mouth every evening.  90 tablet  3  . Tamsulosin HCl (FLOMAX) 0.4 MG CAPS Take 0.4 mg by mouth. 2 by mouth once daily      . Tdap (BOOSTRIX) 5-2.5-18.5 LF-MCG/0.5 injection Inject 0.5 mLs into the muscle once.  0.5 mL  0  . vitamin B-12 (CYANOCOBALAMIN) 1000 MCG tablet Take 1,000 mcg by mouth daily.        No current facility-administered medications on file prior to visit.   Physical exam BP 126/64  Pulse 69  Temp(Src) 97.7 F (36.5 C) (Oral)  Wt 183 lb 3.2 oz (83.099 kg)  SpO2 99%  Constitutional: He appears well-developed and well-nourished. No distress.  HENT:   Head: Normocephalic and atraumatic.   Mouth/Throat: Oropharynx is clear and moist.   Eyes: Conjunctivae are normal. Pupils are equal, round, and reactive to light.   Neck: Normal range of motion. Neck supple. No JVD present. No tracheal deviation present. No thyromegaly present.   Cardiovascular: bradycardia, no murmur appreciated Pulmonary/Chest: Effort normal and breath sounds normal. No respiratory distress. He has no wheezes.  Sternotomy scar present   Abdominal: Soft. Bowel sounds are normal. There is no tenderness.  Musculoskeletal: Normal range of motion. He exhibits no edema.  Uses a cane and has occassional involuntary jerks in both legs and arms Neurological: He is alert. No cranial nerve deficit.  Oriented to person  Skin: Skin is warm and dry.  Psychiatric: He has a normal mood and affect.   Labs- CBC    Component Value Date/Time   WBC 7.8 05/25/2013 0921   WBC 4.8 07/22/2012 0915    RBC 4.72 05/25/2013 0921   RBC 4.00* 07/22/2012 0915   HGB 14.4 05/25/2013 0921   HCT 42.7 05/25/2013 0921   PLT 246 07/22/2012 0915   MCV 91 05/25/2013 0921   MCH 30.5 05/25/2013 0921   MCH 31.0 07/22/2012 0915   MCHC 33.7 05/25/2013 0921   MCHC 33.7 07/22/2012 0915   RDW 14.3 05/25/2013 0921   RDW 14.4 07/22/2012 0915   LYMPHSABS 2.2 05/25/2013 0921   LYMPHSABS 1.1 07/22/2012 0915   MONOABS 0.6 07/22/2012 0915   EOSABS 0.1 05/25/2013 0921   EOSABS 0.1 07/22/2012  0915   BASOSABS 0.0 05/25/2013 0921   BASOSABS 0.0 07/22/2012 0915    CMP     Component Value Date/Time   NA 142 09/14/2013 1058   NA 141 02/26/2013 1130   K 4.4 09/14/2013 1058   CL 99 09/14/2013 1058   CO2 24 09/14/2013 1058   GLUCOSE 78 09/14/2013 1058   GLUCOSE 94 02/26/2013 1130   BUN 32* 09/14/2013 1058   BUN 18 02/26/2013 1130   CREATININE 1.40* 09/14/2013 1058   CREATININE 1.08 02/26/2013 1130   CALCIUM 9.7 09/14/2013 1058   PROT 6.6 09/14/2013 1058   PROT 6.8 07/22/2012 0915   ALBUMIN 3.3* 07/22/2012 0915   AST 15 09/14/2013 1058   ALT 19 09/14/2013 1058   ALKPHOS 145* 09/14/2013 1058   BILITOT 0.3 09/14/2013 1058   GFRNONAA 46* 09/14/2013 1058   GFRAA 53* 09/14/2013 1058   Lab Results  Component Value Date   HGBA1C 7.1* 09/14/2013   Lab Results  Component Value Date   TSH 4.080 09/14/2013    Assessment/plan  1. Type II or unspecified type diabetes mellitus without mention of complication, not stated as uncontrolled Continue metformin 500 mg bid with pravastatin and losartan for now. Continue aspirin. Check a1c today with lipid. Normal foot exam - glucose blood test strip; Use as instructed. One Touch Ultra BlueTest Strips 250.00  Dispense: 100 each; Refill: 2 - CBC with Differential - Hemoglobin A1c  2. HYPOTHYROIDISM Continue levothyroxine 75 mcg daily, check tsh - TSH  3. HYPERTENSION Stable, no med changes to his caridac regimen made today - CMP  4. HYPERLIPIDEMIA Continue asa and pravastatin - Lipid  Panel  5. Neuropathic pain of foot, unspecified laterality Continue gabapentin current regimen and monitor  6. Alzheimer's dementia with behavioral disturbance Will change seroquel to 25 mg qhs with additional 12.5 mg in daytime only for severe agitation as needed. Continue aricept and monitor clinically  7. Increased appetite Will assess his hypothyroidism and dm worsening. Mirtazapine likely contributing some as well. Review labs first. Weight has been stable, infact lost some weight  8. Impacted cerumen, bilateral Ear lavage performed

## 2014-01-26 LAB — COMPREHENSIVE METABOLIC PANEL
A/G RATIO: 2 (ref 1.1–2.5)
ALT: 9 IU/L (ref 0–44)
AST: 11 IU/L (ref 0–40)
Albumin: 4.1 g/dL (ref 3.5–4.7)
Alkaline Phosphatase: 121 IU/L — ABNORMAL HIGH (ref 39–117)
BUN/Creatinine Ratio: 28 — ABNORMAL HIGH (ref 10–22)
BUN: 28 mg/dL — ABNORMAL HIGH (ref 8–27)
CHLORIDE: 99 mmol/L (ref 97–108)
CO2: 23 mmol/L (ref 18–29)
Calcium: 9.3 mg/dL (ref 8.6–10.2)
Creatinine, Ser: 0.99 mg/dL (ref 0.76–1.27)
GFR calc Af Amer: 81 mL/min/{1.73_m2} (ref >59)
GFR, EST NON AFRICAN AMERICAN: 70 mL/min/{1.73_m2} (ref >59)
GLUCOSE: 147 mg/dL — AB (ref 65–99)
Globulin, Total: 2.1 g/dL (ref 1.5–4.5)
POTASSIUM: 3.7 mmol/L (ref 3.5–5.2)
SODIUM: 138 mmol/L (ref 134–144)
TOTAL PROTEIN: 6.2 g/dL (ref 6.0–8.5)
Total Bilirubin: <0.2 mg/dL (ref 0.0–1.2)

## 2014-01-26 LAB — LIPID PANEL
CHOLESTEROL TOTAL: 136 mg/dL (ref 100–199)
Chol/HDL Ratio: 3.7 ratio units (ref 0.0–5.0)
HDL: 37 mg/dL — ABNORMAL LOW (ref >39)
LDL Calculated: 65 mg/dL (ref 0–99)
Triglycerides: 169 mg/dL — ABNORMAL HIGH (ref 0–149)
VLDL CHOLESTEROL CAL: 34 mg/dL (ref 5–40)

## 2014-01-26 LAB — CBC WITH DIFFERENTIAL/PLATELET
BASOS ABS: 0 10*3/uL (ref 0.0–0.2)
Basos: 1 %
EOS ABS: 0.2 10*3/uL (ref 0.0–0.4)
Eos: 3 %
HCT: 36.3 % — ABNORMAL LOW (ref 37.5–51.0)
Hemoglobin: 12.7 g/dL (ref 12.6–17.7)
IMMATURE GRANS (ABS): 0 10*3/uL (ref 0.0–0.1)
Immature Granulocytes: 0 %
LYMPHS: 34 %
Lymphocytes Absolute: 2.2 10*3/uL (ref 0.7–3.1)
MCH: 31.4 pg (ref 26.6–33.0)
MCHC: 35 g/dL (ref 31.5–35.7)
MCV: 90 fL (ref 79–97)
MONOS ABS: 0.5 10*3/uL (ref 0.1–0.9)
Monocytes: 8 %
NEUTROS PCT: 54 %
Neutrophils Absolute: 3.6 10*3/uL (ref 1.4–7.0)
RBC: 4.04 x10E6/uL — AB (ref 4.14–5.80)
RDW: 14.3 % (ref 12.3–15.4)
WBC: 6.5 10*3/uL (ref 3.4–10.8)

## 2014-01-26 LAB — HEMOGLOBIN A1C
Est. average glucose Bld gHb Est-mCnc: 137 mg/dL
Hgb A1c MFr Bld: 6.4 % — ABNORMAL HIGH (ref 4.8–5.6)

## 2014-01-26 LAB — TSH: TSH: 1.28 u[IU]/mL (ref 0.450–4.500)

## 2014-02-01 ENCOUNTER — Encounter: Payer: Self-pay | Admitting: Internal Medicine

## 2014-02-01 ENCOUNTER — Ambulatory Visit (INDEPENDENT_AMBULATORY_CARE_PROVIDER_SITE_OTHER): Payer: Medicare Other | Admitting: Internal Medicine

## 2014-02-01 VITALS — BP 110/60 | HR 88 | Temp 98.2°F | Resp 20 | Ht 68.0 in | Wt 182.4 lb

## 2014-02-01 DIAGNOSIS — H6123 Impacted cerumen, bilateral: Secondary | ICD-10-CM

## 2014-02-01 DIAGNOSIS — H6120 Impacted cerumen, unspecified ear: Secondary | ICD-10-CM

## 2014-02-01 NOTE — Progress Notes (Signed)
Patient ID: Nathaniel Lopez, male   DOB: 11/06/1927, 80 y.o.   MRN: [id number]    Location:    [facility]  Place of Service:  OFFICE    Allergies  Allergen Reactions  . Ace Inhibitors Cough  . Peanut-Containing Drug Products     REACTION: cough    Chief Complaint  Patient presents with  . Follow-up    ear lavage    HPI:  Impacted cerumen, bilateral: right EAC previously lavaged and remains clear. Left EAC has had Debrox for the last week.. Loss of hearing in the left ear.    Medications: Patient's Medications  New Prescriptions   No medications on file  Previous Medications   ASPIRIN 325 MG TABLET    Take 325 mg by mouth daily.     CARBAMIDE PEROXIDE (DEBROX) 6.5 % OTIC SOLUTION    Place 5 drops into the left ear 2 (two) times daily. For 5-7 days followed by ear lavage   DILTIAZEM (CARDIZEM CD) 240 MG 24 HR CAPSULE    Take 1 capsule (240 mg total) by mouth daily.   DONEPEZIL (ARICEPT) 10 MG TABLET    Take 1 tablet (10 mg total) by mouth at bedtime.   FUROSEMIDE (LASIX) 20 MG TABLET    Take 20 mg by mouth daily.   GABAPENTIN (NEURONTIN) 100 MG CAPSULE    TAKE TWO CAPSULES TWICE DAILY FOR PAIN IN FEET   GLUCOSE BLOOD TEST STRIP    Use as instructed. One Touch Ultra BlueTest Strips 250.00   LEVOTHYROXINE (SYNTHROID, LEVOTHROID) 75 MCG TABLET    TAKE ONE TABLET BY MOUTH ONCE DAILY BEFORE BREAKFAST   LOSARTAN (COZAAR) 50 MG TABLET    Take 1 tablet (50 mg total) by mouth daily.   MELATONIN 5 MG TABS    Take 5 mg by mouth 2 (two) times daily. Take one tablet once daily   MIRTAZAPINE (REMERON) 30 MG TABLET    Take 1 tablet (30 mg total) by mouth at bedtime.   NIACIN (NIASPAN) 1000 MG CR TABLET    Take 500 mg by mouth daily.    NITROGLYCERIN (NITROSTAT) 0.4 MG SL TABLET    Place 1 tablet (0.4 mg total) under the tongue every 5 (five) minutes as needed.   PRAVASTATIN (PRAVACHOL) 40 MG TABLET    Take 1 tablet (40 mg total) by mouth every evening.   QUETIAPINE (SEROQUEL) 25 MG TABLET    Take  one tablet daily at bedtime and can take additional half a tablet 12.5 mg during day time as needed for severe agitation only   TAMSULOSIN HCL (FLOMAX) 0.4 MG CAPS    Take 0.4 mg by mouth. 2 by mouth once daily   TDAP (BOOSTRIX) 5-2.5-18.5 LF-MCG/0.5 INJECTION    Inject 0.5 mLs into the muscle once.   VITAMIN B-12 (CYANOCOBALAMIN) 1000 MCG TABLET    Take 1,000 mcg by mouth daily.   Modified Medications   Modified Medication Previous Medication   METFORMIN (GLUCOPHAGE) 500 MG TABLET metFORMIN (GLUCOPHAGE) 500 MG tablet      TAKE ONE TABLET DAILY FOR DIABETES    TAKE ONE TABLET TWICE DAILY FOR DIABETES  Discontinued Medications   No medications on file     Review of Systems  Constitutional: Negative for appetite change.  HENT: Positive for hearing loss. Negative for sore throat, trouble swallowing and voice change.        Known cerumen occlusion.  Respiratory: Negative for shortness of breath.   Musculoskeletal: Positive for gait problem (  using cane).    Filed Vitals:   02/01/14 1527  BP: 110/60  Pulse: 88  Temp: 98.2 F (36.8 C)  TempSrc: Oral  Resp: 20  Height: 5\' 8"  (1.727 m)  Weight: 182 lb 6.4 oz (82.736 kg)  SpO2: 96%   Body mass index is 27.74 kg/(m^2).  Physical Exam  Constitutional: He appears well-developed and well-nourished. No distress.  HENT:  Head: Normocephalic and atraumatic.  Mouth/Throat: Oropharynx is clear and moist.  Left EAC occluded by cerumen  Eyes: Conjunctivae are normal. Pupils are equal, round, and reactive to light.  Neck: Normal range of motion. Neck supple. No JVD present. No tracheal deviation present. No thyromegaly present.  Cardiovascular: Normal rate, regular rhythm and normal heart sounds.   Pulmonary/Chest: Effort normal and breath sounds normal. No respiratory distress. He has no wheezes.  Sternotomy scar present  Abdominal: Soft. Bowel sounds are normal. There is no tenderness.  Musculoskeletal: Normal range of motion. He exhibits  no edema.  Uses a cane and has occasional involuntary jerks  Neurological: He is alert. No cranial nerve deficit.  Oriented to person and place  Skin: Skin is warm and dry.  Psychiatric: He has a normal mood and affect.     Labs reviewed: Office Visit on 01/25/2014  Component Date Value Ref Range Status  . WBC 01/25/2014 6.5  3.4 - 10.8 x10E3/uL Final  . RBC 01/25/2014 4.04* 4.14 - 5.80 x10E6/uL Final  . Hemoglobin 01/25/2014 12.7  12.6 - 17.7 g/dL Final  . HCT 01/25/2014 36.3* 37.5 - 51.0 % Final  . MCV 01/25/2014 90  79 - 97 fL Final  . MCH 01/25/2014 31.4  26.6 - 33.0 pg Final  . MCHC 01/25/2014 35.0  31.5 - 35.7 g/dL Final  . RDW 01/25/2014 14.3  12.3 - 15.4 % Final  . Neutrophils Relative % 01/25/2014 54   Final  . Lymphs 01/25/2014 34   Final  . Monocytes 01/25/2014 8   Final  . Eos 01/25/2014 3   Final  . Basos 01/25/2014 1   Final  . Neutrophils Absolute 01/25/2014 3.6  1.4 - 7.0 x10E3/uL Final  . Lymphocytes Absolute 01/25/2014 2.2  0.7 - 3.1 x10E3/uL Final  . Monocytes Absolute 01/25/2014 0.5  0.1 - 0.9 x10E3/uL Final  . Eosinophils Absolute 01/25/2014 0.2  0.0 - 0.4 x10E3/uL Final  . Basophils Absolute 01/25/2014 0.0  0.0 - 0.2 x10E3/uL Final  . Immature Granulocytes 01/25/2014 0   Final  . Immature Grans (Abs) 01/25/2014 0.0  0.0 - 0.1 x10E3/uL Final  . Glucose 01/25/2014 147* 65 - 99 mg/dL Final  . BUN 01/25/2014 28* 8 - 27 mg/dL Final  . Creatinine, Ser 01/25/2014 0.99  0.76 - 1.27 mg/dL Final  . GFR calc non Af Amer 01/25/2014 70  >59 mL/min/1.73 Final  . GFR calc Af Amer 01/25/2014 81  >59 mL/min/1.73 Final  . BUN/Creatinine Ratio 01/25/2014 28* 10 - 22 Final  . Sodium 01/25/2014 138  134 - 144 mmol/L Final  . Potassium 01/25/2014 3.7  3.5 - 5.2 mmol/L Final  . Chloride 01/25/2014 99  97 - 108 mmol/L Final  . CO2 01/25/2014 23  18 - 29 mmol/L Final  . Calcium 01/25/2014 9.3  8.6 - 10.2 mg/dL Final  . Total Protein 01/25/2014 6.2  6.0 - 8.5 g/dL Final  .  Albumin 01/25/2014 4.1  3.5 - 4.7 g/dL Final  . Globulin, Total 01/25/2014 2.1  1.5 - 4.5 g/dL Final  . Albumin/Globulin Ratio 01/25/2014 2.0  1.1 - 2.5 Final  . Total Bilirubin 01/25/2014 <0.2  0.0 - 1.2 mg/dL Final  . Alkaline Phosphatase 01/25/2014 121* 39 - 117 IU/L Final  . AST 01/25/2014 11  0 - 40 IU/L Final  . ALT 01/25/2014 9  0 - 44 IU/L Final  . TSH 01/25/2014 1.280  0.450 - 4.500 uIU/mL Final  . Hemoglobin A1C 01/25/2014 6.4* 4.8 - 5.6 % Final   Comment:          Increased risk for diabetes: 5.7 - 6.4                                   Diabetes: >6.4                                   Glycemic control for adults with diabetes: <7.0  . Estimated average glucose 01/25/2014 137   Final  . Cholesterol, Total 01/25/2014 136  100 - 199 mg/dL Final  . Triglycerides 01/25/2014 169* 0 - 149 mg/dL Final  . HDL 01/25/2014 37* >39 mg/dL Final   Comment: According to ATP-III Guidelines, HDL-C >59 mg/dL is considered a                          negative risk factor for CHD.  Marland Kitchen VLDL Cholesterol Cal 01/25/2014 34  5 - 40 mg/dL Final  . LDL Calculated 01/25/2014 65  0 - 99 mg/dL Final  . Chol/HDL Ratio 01/25/2014 3.7  0.0 - 5.0 ratio units Final   Comment:                                   T. Chol/HDL Ratio                                                                      Men  Women                                                        1/2 Avg.Risk  3.4    3.3                                                            Avg.Risk  5.0    4.4                                                         2X Avg.Risk  9.6    7.1  3X Avg.Risk 23.4   11.0      Assessment 1. Impacted cerumen -lavaged left EAC with complete removal of cerumen

## 2014-02-02 ENCOUNTER — Other Ambulatory Visit: Payer: Self-pay | Admitting: Cardiovascular Disease

## 2014-02-02 NOTE — Telephone Encounter (Signed)
Rx refill sent to patient pharmacy   

## 2014-02-08 ENCOUNTER — Telehealth: Payer: Self-pay | Admitting: *Deleted

## 2014-02-08 NOTE — Telephone Encounter (Signed)
Patient caregiver, Hilda Blades, called and stated that patient has shingles bilaterally on his lower legs. Not painful because he is already taking Gabapentin but itchy and inflammed. She stated that he has had these for alittle while and feels that it appears better. This is his 3rd bout of it.  She was just calling to let you know. And on the Seroquel the 1/2 as needed is not working. Can she just go back to 12.5 twice daily instead of doing the 25mg . Please Advise.

## 2014-02-08 NOTE — Telephone Encounter (Signed)
If this is the third bout of shingles which is resolving, next time I would like the caregiver to call as soon as she starts seeing the lesion as we would want to stop the virus from causing further damage and start him on medicine for shingles in timely manner- it is useful if given within 72 hours of onset of symptoms.  Yes we can go up on the seroquel to 12.5 bid, please send updated script Also to monitor for signs of infection in the skin on legs

## 2014-02-09 MED ORDER — QUETIAPINE 12.5 MG HALF TABLET: ORAL_TABLET | ORAL | Status: DC

## 2014-02-09 NOTE — Telephone Encounter (Signed)
[place] Notified and agreed. Changed Rx in system.

## 2014-02-09 NOTE — Telephone Encounter (Signed)
LMOM to return call.

## 2014-02-16 ENCOUNTER — Other Ambulatory Visit: Payer: Self-pay | Admitting: Cardiovascular Disease

## 2014-02-16 NOTE — Telephone Encounter (Signed)
Rx was sent to pharmacy electronically. 

## 2014-03-03 ENCOUNTER — Other Ambulatory Visit: Payer: Self-pay | Admitting: *Deleted

## 2014-03-03 MED ORDER — LOSARTAN POTASSIUM 50 MG PO TABS: 50.0000 mg | ORAL_TABLET | Freq: Every day | ORAL | Status: DC

## 2014-03-10 ENCOUNTER — Ambulatory Visit (INDEPENDENT_AMBULATORY_CARE_PROVIDER_SITE_OTHER): Payer: Medicare Other | Admitting: Cardiovascular Disease

## 2014-03-10 VITALS — BP 108/52 | HR 74 | Resp 16 | Ht 66.0 in | Wt 182.0 lb

## 2014-03-10 DIAGNOSIS — I4891 Unspecified atrial fibrillation: Secondary | ICD-10-CM

## 2014-03-10 DIAGNOSIS — I482 Chronic atrial fibrillation, unspecified: Secondary | ICD-10-CM

## 2014-03-10 DIAGNOSIS — R9431 Abnormal electrocardiogram [ECG] [EKG]: Secondary | ICD-10-CM

## 2014-03-10 DIAGNOSIS — I10 Essential (primary) hypertension: Secondary | ICD-10-CM

## 2014-03-10 DIAGNOSIS — I5032 Chronic diastolic (congestive) heart failure: Secondary | ICD-10-CM

## 2014-03-10 DIAGNOSIS — E785 Hyperlipidemia, unspecified: Secondary | ICD-10-CM

## 2014-03-10 DIAGNOSIS — I2510 Atherosclerotic heart disease of native coronary artery without angina pectoris: Secondary | ICD-10-CM

## 2014-03-10 NOTE — Patient Instructions (Signed)
No change with current medication  Your physician wants you to follow-up in 12 month Dr Sallyanne Kuster.  You will receive a reminder letter in the mail two months in advance. If you don't receive a letter, please call our office to schedule the follow-up appointment.

## 2014-03-11 ENCOUNTER — Encounter: Payer: Self-pay | Admitting: Cardiovascular Disease

## 2014-03-11 DIAGNOSIS — R9431 Abnormal electrocardiogram [ECG] [EKG]: Secondary | ICD-10-CM | POA: Insufficient documentation

## 2014-03-11 NOTE — Progress Notes (Signed)
Patient ID: Nathaniel Lopez, male   DOB: September 23, 1927, 80 y.o.   MRN: [id number]      Reason for office visit Followup CAD status post CABG, permanent atrial fibrillation, diastolic heart failure  Nathaniel Lopez is accompanied today, as always, by his daughter, who works at [employer]. There have not been any major changes in his health. He he now spends most days of the week living with his daughter and seems to have improved substantially since then . It is now less frequent that she has to give him a higher dose of diuretic for weight gain/volume retention. His optimum weight seems to be around 180-186 pounds. He has not had angina pectoris.  Remains unsteady on his feet and has stumbled against a wall or against furniture a couple of times although he has not had any serious falls. He has a bruise on his left hand from one of these incidents. Taking Seroquel for dementia-related agitation.  He had coronary bypass surgery in 2006 and has not required cardiac catheterization or had any symptoms of angina pectoris since that time. He has chronic total occlusion of the LAD artery and right coronary artery and a 90% stenosis in the ramus intermedius artery which were treated with a left internal mammary artery and 2 saphenous vein graft bypasses, respectively. Left circumflex system was patent and did not require bypass. He has normal left ventricular systolic function by echocardiography performed July 2014. He does not have significant valvular abnormalities but has biatrial dilatation and permanent atrial fibrillation. He has radiographic evidence of an old basal ganglia ischemic stroke but no recent neurological complaints. He is not on chronic anticoagulation since he is felt to be at high risk for bleeding.      Allergies  Allergen Reactions  . Ace Inhibitors Cough  . Peanut-Containing Drug Products     REACTION: cough    Current Outpatient Prescriptions  Medication Sig Dispense Refill  .  aspirin 325 MG tablet Take 325 mg by mouth daily.        Marland Kitchen diltiazem (CARDIZEM CD) 240 MG 24 hr capsule Take 1 capsule (240 mg total) by mouth daily.  30 capsule  3  . donepezil (ARICEPT) 10 MG tablet Take 1 tablet (10 mg total) by mouth at bedtime.  30 tablet  3  . furosemide (LASIX) 20 MG tablet Take 20 mg by mouth as needed.       . furosemide (LASIX) 20 MG tablet TAKE 2 TABLETS EVERY MORNING AND TAKE ONE TABLET EVERY AFTERNOON  90 tablet  5  . gabapentin (NEURONTIN) 100 MG capsule TAKE TWO CAPSULES TWICE DAILY FOR PAIN IN FEET  120 capsule  3  . glucose blood test strip Use as instructed. One Touch Ultra BlueTest Strips 250.00  100 each  2  . levothyroxine (SYNTHROID, LEVOTHROID) 75 MCG tablet TAKE ONE TABLET BY MOUTH ONCE DAILY BEFORE BREAKFAST  30 tablet  5  . losartan (COZAAR) 50 MG tablet Take 1 tablet (50 mg total) by mouth daily.  30 tablet  11  . Melatonin 5 MG TABS Take 5 mg by mouth 2 (two) times daily. Take one tablet once daily      . metFORMIN (GLUCOPHAGE) 500 MG tablet TAKE ONE TABLET DAILY FOR DIABETES      . mirtazapine (REMERON) 30 MG tablet Take 1 tablet (30 mg total) by mouth at bedtime.  30 tablet  3  . niacin (NIASPAN) 1000 MG CR tablet Take 500 mg by mouth daily.       Marland Kitchen  pravastatin (PRAVACHOL) 40 MG tablet Take 1 tablet (40 mg total) by mouth daily.  90 tablet  2  . QUEtiapine (SEROQUEL) 12.5 mg TABS tablet Take one tablet by mouth twice daily  60 tablet  1  . Tamsulosin HCl (FLOMAX) 0.4 MG CAPS Take 0.4 mg by mouth. 2 by mouth once daily      . Tdap (BOOSTRIX) 5-2.5-18.5 LF-MCG/0.5 injection Inject 0.5 mLs into the muscle once.  0.5 mL  0  . vitamin B-12 (CYANOCOBALAMIN) 1000 MCG tablet Take 1,000 mcg by mouth daily.        No current facility-administered medications for this visit.    Past Medical History  Diagnosis Date  . CHF (congestive heart failure)     diastolic dysfunction  . Atrial fibrillation     permanent  . Colon cancer   . Carotid arterial  disease   . COPD (chronic obstructive pulmonary disease)     moderate 2006 by PFT FEV1 1.39  . Hyperlipidemia   . Type 2 diabetes mellitus   . Hypertension   . Hypothyroid   . Pancreatic cyst 12/14/2011    MRI - stable cyst lesion, likely benign  . Myocardial infarct   . Coronary artery disease   . Prostatitis   . Gallstones   . TIA (transient ischemic attack)   . Tremor   . Carotid bruit 12/17/2010    doppler - ICAs normal patency w/o evidence of significant diameter reduction/dissection; R ECA turbulent flowthroughout, possible source of bruit  . Cerebral atherosclerosis 11/10/2009    doppler - R bulb and proximal ICA 50-69% diameter reduction; R ECA high grade stenosis; L ICA 0-49% diameter reduction  . Carotid bruit 10/21/2006    doppler - R bulb and proximal ICA 50-69% diameter reduction, R ECA high grade stenosis; L ICA 0-49% diameter reduction  . Dementia   . Incomplete bladder emptying   . Acute pyelonephritis without lesion of renal medullary necrosis   . Full incontinence of feces   . Other specified disease of pancreas   . Unspecified disorder of prostate   . Herpes zoster without mention of complication   . Pain in joint, pelvic region and thigh   . Other vitamin B12 deficiency anemia   . Vascular dementia, uncomplicated   . Muscle weakness (generalized)   . Other specified disorder of skin   . Depression   . Coronary atherosclerosis of unspecified type of vessel, native or graft     Past Surgical History  Procedure Laterality Date  . Hernia repair  2003  . Cholecystectomy  1990  . Coronary artery bypass graft  2006    LIMA to LAD; RSV to ICA; RSV to posterior descending CA  . Colon surgery  1989  . Cataract extraction Right 2006  . Tee with cardioversion  04/16/2010    EF 55-60%; LA mod dialted; AF precludes elval of LV diastolic fcn; septal motion consistent w/ post-thoracotomy state  . Cardiac catheterization  12/03/2004    total occulsion of LAD w/ retrograde  filing, total occlusion RCA w/ l to r collaterals, 90% proximal optional diagonal disease; normal LV systolic fcn, no renal artery stenosis;   . Colonoscopy  01/27/2008    Dr.Magod, polyps  . Colonoscopy  03/02/2009    Dr.Magod, adenomatous and hyperplastic polyps no malignancy     Family History  Problem Relation Age of Onset  . Heart attack Mother   . Breast cancer Mother   . Heart disease Mother 64  myocardiomyopathy  . Alzheimer's disease Sister     History   Social History  . Marital Status: Widowed    Spouse Name: N/A    Number of Children: N/A  . Years of Education: N/A   Occupational History  . 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 Smoking status: Former Smoker -- 2.00 packs/day for 20 years    Types: Cigarettes    Quit date: 07/01/1968  . Smokeless tobacco: Never Used  . Alcohol Use: 0.6 oz/week    1 Cans of beer per week     Comment: social  . Drug Use: No  . Sexual Activity: Not on file   Other Topics Concern  . Not on file   Social History Narrative   Pt is Jehovah's Witness   MMSE completed 07/29/2011 (25)   Last Annual Exam 07/29/2011 ([facility])    Review of systems: The patient specifically denies any chest pain at rest or with exertion, dyspnea at rest or with exertion, orthopnea, paroxysmal nocturnal dyspnea, syncope, palpitations, focal neurological deficits, intermittent claudication, lower extremity edema, unexplained weight gain, cough, hemoptysis or wheezing.  The patient also denies abdominal pain, nausea, vomiting, dysphagia, diarrhea, constipation, polyuria, polydipsia, dysuria, hematuria, frequency, urgency, abnormal bleeding or bruising, fever, chills, unexpected weight changes, mood swings, change in skin or hair texture, change in voice quality, auditory or visual problems, allergic reactions or rashes, new musculoskeletal complaints other than usual "aches and pains".   PHYSICAL EXAM BP 108/52  Pulse 74   Resp 16  Ht 5\' 6"  (1.676 m)  Wt 182 lb (82.555 kg)  BMI 29.39 kg/m2 General: Alert, oriented x3, no distress  Head: no evidence of trauma, PERRL, EOMI, no exophtalmos or lid lag, no myxedema, no xanthelasma; normal ears, nose and oropharynx  Neck: normal jugular venous pulsations and no hepatojugular reflux; brisk carotid pulses without delay and no carotid bruits  Chest: clear to auscultation, no signs of consolidation by percussion or palpation, normal fremitus, symmetrical and full respiratory excursions; sternotomy scar  Cardiovascular: normal position and quality of the apical impulse, irregular rhythm, normal first and second heart sounds, no murmurs, rubs or gallops  Abdomen: no tenderness or distention, no masses by palpation, no abnormal pulsatility or arterial bruits, normal bowel sounds, no hepatosplenomegaly  Extremities: no clubbing, cyanosis or edema; 2+ radial, ulnar and brachial pulses bilaterally; 2+ right femoral, posterior tibial and dorsalis pedis pulses; 2+ left femoral, posterior tibial and dorsalis pedis pulses; no subclavian or femoral bruits, healed right saphenectomy scars the right ankle is a little puffy but not overtly swollen2  Neurological: grossly nonfocal   EKG: Atrial fibrillation, well rate controlled. A single wide complex beat may be a PVC or aberrant conduction, otherwise normal. QTC 451 ms  Lipid Panel     Component Value Date/Time   TRIG 169* 01/25/2014 1733   HDL 37* 01/25/2014 1733   CHOLHDL 3.7 01/25/2014 1733   LDLCALC 65 01/25/2014 1733    BMET    Component Value Date/Time   NA 138 01/25/2014 1733   NA 141 02/26/2013 1130   K 3.7 01/25/2014 1733   CL 99 01/25/2014 1733   CO2 23 01/25/2014 1733   GLUCOSE 147* 01/25/2014 1733   GLUCOSE 94 02/26/2013 1130   BUN 28* 01/25/2014 1733   BUN 18 02/26/2013 1130   CREATININE 0.99 01/25/2014 1733   CREATININE 1.08 02/26/2013 1130   CALCIUM 9.3 01/25/2014 1733   GFRNONAA 70 01/25/2014  [place] 01/25/2014  1733     ASSESSMENT AND PLAN  ATRIAL FIBRILLATION , permanent Excellent rate control even after we stopped the digoxin. He is on aspirin for stroke prophylaxis due to the high risk of falls.   CAD (coronary artery disease) s/p CABG 2006  July 2006 Dr. Servando Snare off-pump CABG x3 (LIMA to LAD, SVG to intermediate, SVG to PDA). Asymptomatic, preserved left ventricular systolic function. No angina  Chronic diastolic heart failure  Have advised that the dose of furosemide be increased to 40 mg daily on days when his weight exceeds 190 pounds. This has become less frequently needed with better control of sodium and fluid intake.  HYPERLIPIDEMIA  Excellent lipid profile, much improved from last year.  HYPERTENSION  Satisfactory control.  Prolonged QT interval QT elongation is only mild and I don't think he needs to stop taking any of his current medicines. Both his Seroquel and Remeron are probably contributing to this. Avoid prescribing any other QT prolonging drugs such as macrolide antibiotics or quinolones.  Holli Humbles, MD, [place] [phone] office [phone] pager

## 2014-04-29 ENCOUNTER — Other Ambulatory Visit: Payer: Self-pay | Admitting: *Deleted

## 2014-04-29 DIAGNOSIS — E139 Other specified diabetes mellitus without complications: Secondary | ICD-10-CM

## 2014-04-29 MED ORDER — GLUCOSE BLOOD VI STRP: ORAL_STRIP | Status: AC

## 2014-04-29 NOTE — Telephone Encounter (Signed)
[facility] 

## 2014-05-18 ENCOUNTER — Ambulatory Visit: Payer: Self-pay | Admitting: Internal Medicine

## 2014-06-21 ENCOUNTER — Encounter (HOSPITAL_COMMUNITY): Payer: Self-pay

## 2014-06-21 ENCOUNTER — Encounter: Payer: Self-pay | Admitting: Internal Medicine

## 2014-06-21 ENCOUNTER — Ambulatory Visit (INDEPENDENT_AMBULATORY_CARE_PROVIDER_SITE_OTHER): Payer: Medicare Other | Admitting: Internal Medicine

## 2014-06-21 ENCOUNTER — Inpatient Hospital Stay (HOSPITAL_COMMUNITY)
Admission: EM | Admit: 2014-06-21 | Discharge: 2014-06-25 | DRG: 314 | Disposition: A | Discharge status: Home / Self Care | Payer: Medicare Other | Attending: Internal Medicine | Admitting: Internal Medicine

## 2014-06-21 ENCOUNTER — Emergency Department (HOSPITAL_COMMUNITY): Payer: Medicare Other

## 2014-06-21 ENCOUNTER — Ambulatory Visit (INDEPENDENT_AMBULATORY_CARE_PROVIDER_SITE_OTHER): Payer: Medicare Other

## 2014-06-21 VITALS — BP 100/62 | HR 90 | Temp 98.7°F | Resp 10 | Ht 66.0 in | Wt 175.0 lb

## 2014-06-21 DIAGNOSIS — Z8673 Personal history of transient ischemic attack (TIA), and cerebral infarction without residual deficits: Secondary | ICD-10-CM

## 2014-06-21 DIAGNOSIS — I5032 Chronic diastolic (congestive) heart failure: Secondary | ICD-10-CM | POA: Diagnosis present

## 2014-06-21 DIAGNOSIS — Z85038 Personal history of other malignant neoplasm of large intestine: Secondary | ICD-10-CM

## 2014-06-21 DIAGNOSIS — G629 Polyneuropathy, unspecified: Secondary | ICD-10-CM

## 2014-06-21 DIAGNOSIS — Z951 Presence of aortocoronary bypass graft: Secondary | ICD-10-CM

## 2014-06-21 DIAGNOSIS — Z66 Do not resuscitate: Secondary | ICD-10-CM | POA: Diagnosis present

## 2014-06-21 DIAGNOSIS — F329 Major depressive disorder, single episode, unspecified: Secondary | ICD-10-CM | POA: Diagnosis present

## 2014-06-21 DIAGNOSIS — J439 Emphysema, unspecified: Secondary | ICD-10-CM | POA: Diagnosis present

## 2014-06-21 DIAGNOSIS — G5790 Unspecified mononeuropathy of unspecified lower limb: Secondary | ICD-10-CM

## 2014-06-21 DIAGNOSIS — E1142 Type 2 diabetes mellitus with diabetic polyneuropathy: Secondary | ICD-10-CM | POA: Diagnosis present

## 2014-06-21 DIAGNOSIS — Z79899 Other long term (current) drug therapy: Secondary | ICD-10-CM | POA: Diagnosis not present

## 2014-06-21 DIAGNOSIS — N183 Chronic kidney disease, stage 3 unspecified: Secondary | ICD-10-CM | POA: Diagnosis present

## 2014-06-21 DIAGNOSIS — R1310 Dysphagia, unspecified: Secondary | ICD-10-CM | POA: Diagnosis present

## 2014-06-21 DIAGNOSIS — I129 Hypertensive chronic kidney disease with stage 1 through stage 4 chronic kidney disease, or unspecified chronic kidney disease: Secondary | ICD-10-CM | POA: Diagnosis present

## 2014-06-21 DIAGNOSIS — Z7982 Long term (current) use of aspirin: Secondary | ICD-10-CM

## 2014-06-21 DIAGNOSIS — N400 Enlarged prostate without lower urinary tract symptoms: Secondary | ICD-10-CM | POA: Diagnosis present

## 2014-06-21 DIAGNOSIS — I959 Hypotension, unspecified: Principal | ICD-10-CM | POA: Diagnosis present

## 2014-06-21 DIAGNOSIS — E785 Hyperlipidemia, unspecified: Secondary | ICD-10-CM | POA: Diagnosis present

## 2014-06-21 DIAGNOSIS — G309 Alzheimer's disease, unspecified: Secondary | ICD-10-CM | POA: Diagnosis present

## 2014-06-21 DIAGNOSIS — J449 Chronic obstructive pulmonary disease, unspecified: Secondary | ICD-10-CM | POA: Diagnosis present

## 2014-06-21 DIAGNOSIS — I482 Chronic atrial fibrillation, unspecified: Secondary | ICD-10-CM | POA: Diagnosis present

## 2014-06-21 DIAGNOSIS — E039 Hypothyroidism, unspecified: Secondary | ICD-10-CM | POA: Diagnosis present

## 2014-06-21 DIAGNOSIS — F0281 Dementia in other diseases classified elsewhere with behavioral disturbance: Secondary | ICD-10-CM

## 2014-06-21 DIAGNOSIS — G308 Other Alzheimer's disease: Secondary | ICD-10-CM

## 2014-06-21 DIAGNOSIS — M792 Neuralgia and neuritis, unspecified: Secondary | ICD-10-CM

## 2014-06-21 DIAGNOSIS — Z87891 Personal history of nicotine dependence: Secondary | ICD-10-CM | POA: Diagnosis not present

## 2014-06-21 DIAGNOSIS — R001 Bradycardia, unspecified: Secondary | ICD-10-CM | POA: Diagnosis present

## 2014-06-21 DIAGNOSIS — I2510 Atherosclerotic heart disease of native coronary artery without angina pectoris: Secondary | ICD-10-CM | POA: Diagnosis present

## 2014-06-21 DIAGNOSIS — F0150 Vascular dementia without behavioral disturbance: Secondary | ICD-10-CM | POA: Diagnosis present

## 2014-06-21 DIAGNOSIS — I5033 Acute on chronic diastolic (congestive) heart failure: Secondary | ICD-10-CM | POA: Diagnosis present

## 2014-06-21 DIAGNOSIS — R55 Syncope and collapse: Secondary | ICD-10-CM | POA: Diagnosis present

## 2014-06-21 DIAGNOSIS — I480 Paroxysmal atrial fibrillation: Secondary | ICD-10-CM

## 2014-06-21 DIAGNOSIS — F02818 Dementia in other diseases classified elsewhere, unspecified severity, with other behavioral disturbance: Secondary | ICD-10-CM

## 2014-06-21 DIAGNOSIS — R0602 Shortness of breath: Secondary | ICD-10-CM

## 2014-06-21 DIAGNOSIS — Z9841 Cataract extraction status, right eye: Secondary | ICD-10-CM

## 2014-06-21 DIAGNOSIS — Z23 Encounter for immunization: Secondary | ICD-10-CM

## 2014-06-21 DIAGNOSIS — I252 Old myocardial infarction: Secondary | ICD-10-CM

## 2014-06-21 LAB — CBC WITH DIFFERENTIAL/PLATELET
BASOS ABS: 0 10*3/uL (ref 0.0–0.1)
BASOS PCT: 0 % (ref 0–1)
EOS ABS: 0.1 10*3/uL (ref 0.0–0.7)
Eosinophils Relative: 1 % (ref 0–5)
HCT: 38.6 % — ABNORMAL LOW (ref 39.0–52.0)
Hemoglobin: 13.1 g/dL (ref 13.0–17.0)
LYMPHS ABS: 2.4 10*3/uL (ref 0.7–4.0)
Lymphocytes Relative: 35 % (ref 12–46)
MCH: 31.6 pg (ref 26.0–34.0)
MCHC: 33.9 g/dL (ref 30.0–36.0)
MCV: 93 fL (ref 78.0–100.0)
Monocytes Absolute: 0.5 10*3/uL (ref 0.1–1.0)
Monocytes Relative: 7 % (ref 3–12)
NEUTROS PCT: 57 % (ref 43–77)
Neutro Abs: 3.8 10*3/uL (ref 1.7–7.7)
Platelets: 238 10*3/uL (ref 150–400)
RBC: 4.15 MIL/uL — AB (ref 4.22–5.81)
RDW: 13.8 % (ref 11.5–15.5)
WBC: 6.7 10*3/uL (ref 4.0–10.5)

## 2014-06-21 LAB — URINALYSIS, ROUTINE W REFLEX MICROSCOPIC
BILIRUBIN URINE: NEGATIVE
GLUCOSE, UA: NEGATIVE mg/dL
HGB URINE DIPSTICK: NEGATIVE
Ketones, ur: NEGATIVE mg/dL
Leukocytes, UA: NEGATIVE
Nitrite: NEGATIVE
Protein, ur: NEGATIVE mg/dL
Specific Gravity, Urine: 1.019 (ref 1.005–1.030)
UROBILINOGEN UA: 1 mg/dL (ref 0.0–1.0)
pH: 5.5 (ref 5.0–8.0)

## 2014-06-21 LAB — BASIC METABOLIC PANEL
ANION GAP: 11 (ref 5–15)
BUN: 20 mg/dL (ref 6–23)
CALCIUM: 8.7 mg/dL (ref 8.4–10.5)
CO2: 21 mmol/L (ref 19–32)
Chloride: 107 mEq/L (ref 96–112)
Creatinine, Ser: 1.24 mg/dL (ref 0.50–1.35)
GFR, EST AFRICAN AMERICAN: 60 mL/min — AB (ref >90)
GFR, EST NON AFRICAN AMERICAN: 52 mL/min — AB (ref >90)
Glucose, Bld: 144 mg/dL — ABNORMAL HIGH (ref 70–99)
POTASSIUM: 3.5 mmol/L (ref 3.5–5.1)
Sodium: 139 mmol/L (ref 135–145)

## 2014-06-21 LAB — I-STAT TROPONIN, ED: TROPONIN I, POC: 0.01 ng/mL (ref 0.00–0.08)

## 2014-06-21 LAB — BRAIN NATRIURETIC PEPTIDE: B Natriuretic Peptide: 124.8 pg/mL — ABNORMAL HIGH (ref 0.0–100.0)

## 2014-06-21 MED ORDER — FUROSEMIDE 20 MG PO TABS: 20.0000 mg | ORAL_TABLET | Freq: Every day | ORAL | Status: AC | PRN

## 2014-06-21 MED ORDER — SODIUM CHLORIDE 0.9 % IV BOLUS (SEPSIS)
500.0000 mL | Freq: Once | INTRAVENOUS | Status: AC
  Administered 2014-06-21: 500 mL via INTRAVENOUS

## 2014-06-21 MED ORDER — QUETIAPINE FUMARATE 25 MG PO TABS: 25.0000 mg | ORAL_TABLET | Freq: Every day | ORAL | Status: AC

## 2014-06-21 NOTE — Progress Notes (Signed)
Patient ID: Nathaniel Lopez, male   DOB: 1928-05-07, 80 y.o.   MRN: [id number]    Chief Complaint  Patient presents with  . Medical Management of Chronic Issues    4 month follow-up, medication mangement (makes sure meds are accurate)    Allergies  Allergen Reactions  . Ace Inhibitors Cough  . Peanut-Containing Drug Products     REACTION: cough   HPI 80 y/o male pt here for RV with his daughter in law. He has hx of afib, CAD, htn and dm along with dementia. seroquel has helped with his agitation and gabapentin with his nerve pain. He is on seroquel 25 mg daily and tolerating this well. SBP 120-130/70-80 and HR has been in 80s. Getting lasix only on as needed basis, has not required it this week.  cbg has been around 110-130.   Review of Systems : mainly from daughter in law Constitutional: Negative for fever, chills, diaphoresis. low energy level as per daughter in law HENT: Negative for congestion, sore throat.   Eyes: Negative for blurred vision, double vision and discharge. pending eye doctor visit. Respiratory: Negative for dyspnea and wheezing. Has cough but unable to bring his cough out. Tends to cough with his meals.  Cardiovascular: Negative for chest pain, palpitations, leg swelling.  Gastrointestinal: Negative for heartburn, nausea, vomiting, abdominal pain, has altered bowel  Movement, no blood in stool. Gets mashed and boiled food, finely chopped meals, apple sauce. There has been episodes with concerns for choking. Genitourinary: Negative for dysuria, flank pain. has urinary incontinence Musculoskeletal: Negative for back pain,  joint pain and myalgias. has unsteady gait and tends to bump into things and bruises easily. Uses a cane occassionally. No falls recently Skin: Negative for itching and rash.  Neurological: Negative for dizziness, tingling. occassional headaches which self resolves or with help of tylenol  Psychiatric/Behavioral: memory loss persists, daughter in law  mentions it to have progressed. The patient is not nervous/anxious.  independent with toileting and dressing. He is helped with shower once a week. He is able to feed himself.   Past Medical History  Diagnosis Date  . CHF (congestive heart failure)     diastolic dysfunction  . Atrial fibrillation     permanent  . Colon cancer   . Carotid arterial disease   . COPD (chronic obstructive pulmonary disease)     moderate 2006 by PFT FEV1 1.39  . Hyperlipidemia   . Type 2 diabetes mellitus   . Hypertension   . Hypothyroid   . Pancreatic cyst 12/14/2011    MRI - stable cyst lesion, likely benign  . Myocardial infarct   . Coronary artery disease   . Prostatitis   . Gallstones   . TIA (transient ischemic attack)   . Tremor   . Carotid bruit 12/17/2010    doppler - ICAs normal patency w/o evidence of significant diameter reduction/dissection; R ECA turbulent flowthroughout, possible source of bruit  . Cerebral atherosclerosis 11/10/2009    doppler - R bulb and proximal ICA 50-69% diameter reduction; R ECA high grade stenosis; L ICA 0-49% diameter reduction  . Carotid bruit 10/21/2006    doppler - R bulb and proximal ICA 50-69% diameter reduction, R ECA high grade stenosis; L ICA 0-49% diameter reduction  . Dementia   . Incomplete bladder emptying   . Acute pyelonephritis without lesion of renal medullary necrosis   . Full incontinence of feces   . Other specified disease of pancreas   . Unspecified disorder of  prostate   . Herpes zoster without mention of complication   . Pain in joint, pelvic region and thigh   . Other vitamin B12 deficiency anemia   . Vascular dementia, uncomplicated   . Muscle weakness (generalized)   . Other specified disorder of skin   . Depression   . Coronary atherosclerosis of unspecified type of vessel, native or graft    Past Surgical History  Procedure Laterality Date  . Hernia repair  2003  . Cholecystectomy  1990  . Coronary artery bypass graft  2006     LIMA to LAD; RSV to ICA; RSV to posterior descending CA  . Colon surgery  1989  . Cataract extraction Right 2006  . Tee with cardioversion  04/16/2010    EF 55-60%; LA mod dialted; AF precludes elval of LV diastolic fcn; septal motion consistent w/ post-thoracotomy state  . Cardiac catheterization  12/03/2004    total occulsion of LAD w/ retrograde filing, total occlusion RCA w/ l to r collaterals, 90% proximal optional diagonal disease; normal LV systolic fcn, no renal artery stenosis;   . Colonoscopy  01/27/2008    Dr.Magod, polyps  . Colonoscopy  03/02/2009    Dr.Magod, adenomatous and hyperplastic polyps no malignancy       Medication List       This list is accurate as of: 06/21/14  5:50 PM.  Always use your most recent med list.               aspirin 325 MG tablet  Take 325 mg by mouth daily.     diltiazem 240 MG 24 hr capsule  Commonly known as:  CARDIZEM CD  Take 1 capsule (240 mg total) by mouth daily.     donepezil 10 MG tablet  Commonly known as:  ARICEPT  Take 1 tablet (10 mg total) by mouth at bedtime.     FLOMAX 0.4 MG Caps capsule  Generic drug:  tamsulosin  Take 0.4 mg by mouth. 2 by mouth once daily     furosemide 20 MG tablet  Commonly known as:  LASIX  Take 1 tablet (20 mg total) by mouth daily as needed.     gabapentin 100 MG capsule  Commonly known as:  NEURONTIN  TAKE TWO CAPSULES TWICE DAILY FOR PAIN IN FEET     glucose blood test strip  One Touch Ultra Blue Test Strip. Use strip to check glucose twice daily. Dx. E11.9     levothyroxine 75 MCG tablet  Commonly known as:  SYNTHROID, LEVOTHROID  TAKE ONE TABLET BY MOUTH ONCE DAILY BEFORE BREAKFAST     losartan 50 MG tablet  Commonly known as:  COZAAR  Take 1 tablet (50 mg total) by mouth daily.     Melatonin 5 MG Tabs  Take 5 mg by mouth at bedtime.     metFORMIN 500 MG tablet  Commonly known as:  GLUCOPHAGE  TAKE ONE TABLET DAILY FOR DIABETES     mirtazapine 30 MG tablet  Commonly  known as:  REMERON  Take 1 tablet (30 mg total) by mouth at bedtime.     niacin 1000 MG CR tablet  Commonly known as:  NIASPAN  Take 500 mg by mouth daily.     pravastatin 40 MG tablet  Commonly known as:  PRAVACHOL  Take 1 tablet (40 mg total) by mouth daily.     QUEtiapine 25 MG tablet  Commonly known as:  SEROQUEL  Take 1 tablet (25 mg total) by  mouth daily.     vitamin B-12 1000 MCG tablet  Commonly known as:  CYANOCOBALAMIN  Take 1,000 mcg by mouth daily.        Physical exam BP 100/62 mmHg  Pulse 90  Temp(Src) 98.7 F (37.1 C) (Oral)  Resp 10  Ht 5\' 6"  (1.676 m)  Wt 175 lb (79.379 kg)  BMI 28.26 kg/m2  SpO2 97%  Wt Readings from Last 3 Encounters:  06/21/14 175 lb (79.379 kg)  03/10/14 182 lb (82.555 kg)  02/01/14 182 lb 6.4 oz (82.736 kg)    Constitutional: He appears well-developed and well-nourished. No distress.   HENT:   Head: Normocephalic and atraumatic.   Eyes: Conjunctivae are normal. Pupils are equal, round, and reactive to light.   Neck: Normal range of motion. Neck supple.  Cardiovascular: normal s1,s2, no murmur appreciated Pulmonary/Chest: Effort normal and breath sounds normal. No respiratory distress. He has no wheezes. Sternotomy scar present   Abdominal: Soft. Bowel sounds are normal. There is no tenderness.  Musculoskeletal: Normal range of motion. He exhibits no edema.  Neurological: He is alert. No cranial nerve deficit. Oriented to person   Skin: Skin is warm and dry.  Psychiatric: He has a normal mood and affect.   Labs:  Lab Results  Component Value Date   HGBA1C 6.4* 01/25/2014   CBC Latest Ref Rng 01/25/2014 05/25/2013 11/26/2012  WBC 3.4 - 10.8 x10E3/uL 6.5 7.8 6.4  Hemoglobin 12.6 - 17.7 g/dL 12.7 14.4 13.5  Hematocrit 37.5 - 51.0 % 36.3(L) 42.7 41.1  Platelets 150 - 400 K/uL - - -   CMP Latest Ref Rng 01/25/2014 09/14/2013 05/25/2013  Glucose 65 - 99 mg/dL 147(H) 78 128(H)  BUN 8 - 27 mg/dL 28(H) 32(H) 22  Creatinine  0.76 - 1.27 mg/dL 0.99 1.40(H) 1.25  Sodium 134 - 144 mmol/L 138 142 143  Potassium 3.5 - 5.2 mmol/L 3.7 4.4 4.5  Chloride 97 - 108 mmol/L 99 99 100  CO2 18 - 29 mmol/L 23 24 24   Calcium 8.6 - 10.2 mg/dL 9.3 9.7 10.1  Total Protein 6.0 - 8.5 g/dL 6.2 6.6 7.2  Albumin 3.5 - 4.7 g/dL 4.1 4.4 4.4  Total Bilirubin 0.0 - 1.2 mg/dL <0.2 0.3 0.3  Alkaline Phos 39 - 117 IU/L 121(H) 145(H) 165(H)  AST 0 - 40 IU/L 11 15 14   ALT 0 - 44 IU/L 9 19 15    Lipid Panel     Component Value Date/Time   TRIG 169* 01/25/2014 1733   HDL 37* 01/25/2014 1733   CHOLHDL 3.7 01/25/2014 1733   LDLCALC 65 01/25/2014 1733   Lab Results  Component Value Date   TSH 1.280 01/25/2014    Assessment/plan  1. Type 2 diabetes mellitus with diabetic polyneuropathy Check a1c today. Continue metformin 500 mg daily with his losartan, aspirin and pravastatin. Check urine microalbumin, pending eye exam- daughter in law to make appointment - Hemoglobin A1c - Microalbumin/Creatinine Ratio, Urine - CBC with Differential; Future - CMP; Future - Hemoglobin A1c; Future  2. Chronic diastolic heart failure euvlemic on exam, on prn lasix with losartan, followed by cardiology, recent notes reviewed - CBC with Differential; Future - CMP; Future  3. Hypothyroidism, unspecified hypothyroidism type On levothyroxine, continue this, last tsh reviewed, labs prior to next visit - CBC with Differential; Future - TSH; Future  4. Alzheimer's dementia with behavioral disturbance Slow decline, decline anticipated, monitor po intake, needing assistance with ADL and has supportive family. Continue aricpet, niacin, b12 and aspirin.  Continue skin care. Aspiration precautions and fall precautions  5. Neuropathic pain of foot, unspecified laterality Continue gabaepntin, encouraged to use cane and to take fall precautions  6. Hyperlipidemia LDL goal <100 Continue pravastatin and niacin - Lipid Panel; Future  7. Dysphagia No recent  formal evaluation with FEES study or evaluation by SLP team. His dementia can worsen it. Daughter in law wants to hold off on this for now. With his unproductive cough and cough between meals and episodes of ? Choking, i would recommend a formal swallow evaluation and have him on dysphagia diet. Explained risks with aspiration and daughter in law understands this but would defer it for now. She will notify if she feels it to be appropriate

## 2014-06-21 NOTE — ED Notes (Signed)
Attempted report to floor; RN to call back  

## 2014-06-21 NOTE — ED Notes (Signed)
Copy of power of attorney placed in pt's chart.

## 2014-06-21 NOTE — ED Notes (Signed)
Per EMS: pt was at a restaurant when he had a near syncopal episode and became unresponsive; gaze noted to be fixed.  Pt initially hypotensive and bradycardic on EMS arrival; HR 30 and BP 50 palpated; pt given 500cc NS bolus.  NPA placed and ventilations initially assisted.  Pt currently CAO x 4.  CBG 175.

## 2014-06-21 NOTE — ED Provider Notes (Signed)
CSN: [id number]     Arrival date & time 06/21/14  1935 History   First MD Initiated Contact with Patient 06/21/14 1943     Chief Complaint  Patient presents with  . Near Syncope  . Hypotension     (Consider location/radiation/quality/duration/timing/severity/associated sxs/prior Treatment) Patient is a 80 y.o. male presenting with syncope. The history is provided by the patient and a relative (daughter).  Loss of Consciousness Episode history:  Single Most recent episode:  Today Duration:  15 minutes (per daughter) Progression:  Resolved Chronicity:  New Context comment:  Eating dinner Witnessed: yes   Relieved by:  Nothing Worsened by:  Nothing tried Ineffective treatments:  None tried Associated symptoms: no anxiety, no chest pain, no diaphoresis, no dizziness, no fever, no headaches, no nausea, no palpitations, no recent injury, no shortness of breath, no vomiting and no weakness   Risk factors: coronary artery disease     Past Medical History  Diagnosis Date  . CHF (congestive heart failure)     diastolic dysfunction  . Atrial fibrillation     permanent  . Colon cancer   . Carotid arterial disease   . COPD (chronic obstructive pulmonary disease)     moderate 2006 by PFT FEV1 1.39  . Hyperlipidemia   . Type 2 diabetes mellitus   . Hypertension   . Hypothyroid   . Pancreatic cyst 12/14/2011    MRI - stable cyst lesion, likely benign  . Myocardial infarct   . Coronary artery disease   . Prostatitis   . Gallstones   . TIA (transient ischemic attack)   . Tremor   . Carotid bruit 12/17/2010    doppler - ICAs normal patency w/o evidence of significant diameter reduction/dissection; R ECA turbulent flowthroughout, possible source of bruit  . Cerebral atherosclerosis 11/10/2009    doppler - R bulb and proximal ICA 50-69% diameter reduction; R ECA high grade stenosis; L ICA 0-49% diameter reduction  . Carotid bruit 10/21/2006    doppler - R bulb and proximal ICA 50-69%  diameter reduction, R ECA high grade stenosis; L ICA 0-49% diameter reduction  . Dementia   . Incomplete bladder emptying   . Acute pyelonephritis without lesion of renal medullary necrosis   . Full incontinence of feces   . Other specified disease of pancreas   . Unspecified disorder of prostate   . Herpes zoster without mention of complication   . Pain in joint, pelvic region and thigh   . Other vitamin B12 deficiency anemia   . Vascular dementia, uncomplicated   . Muscle weakness (generalized)   . Other specified disorder of skin   . Depression   . Coronary atherosclerosis of unspecified type of vessel, native or graft    Past Surgical History  Procedure Laterality Date  . Hernia repair  2003  . Cholecystectomy  1990  . Coronary artery bypass graft  2006    LIMA to LAD; RSV to ICA; RSV to posterior descending CA  . Colon surgery  1989  . Cataract extraction Right 2006  . Tee with cardioversion  04/16/2010    EF 55-60%; LA mod dialted; AF precludes elval of LV diastolic fcn; septal motion consistent w/ post-thoracotomy state  . Cardiac catheterization  12/03/2004    total occulsion of LAD w/ retrograde filing, total occlusion RCA w/ l to r collaterals, 90% proximal optional diagonal disease; normal LV systolic fcn, no renal artery stenosis;   . Colonoscopy  01/27/2008    Dr.Magod, polyps  .  Colonoscopy  03/02/2009    Dr.Magod, adenomatous and hyperplastic polyps no malignancy    Family History  Problem Relation Age of Onset  . Heart attack Mother   . Breast cancer Mother   . Heart disease Mother 27    myocardiomyopathy  . Alzheimer's disease Sister    History  Substance Use Topics  . Smoking status: Former Smoker -- 2.00 packs/day for 20 years    Types: Cigarettes    Quit date: 07/01/1968  . Smokeless tobacco: Never Used  . Alcohol Use: 0.6 oz/week    1 Cans of beer per week     Comment: social    Review of Systems  Constitutional: Negative for fever, diaphoresis,  activity change and appetite change.  HENT: Negative for facial swelling, sore throat, tinnitus, trouble swallowing and voice change.   Eyes: Negative for pain, redness and visual disturbance.  Respiratory: Negative for chest tightness, shortness of breath and wheezing.   Cardiovascular: Positive for syncope. Negative for chest pain, palpitations and leg swelling.  Gastrointestinal: Negative for nausea, vomiting, abdominal pain, diarrhea, constipation and abdominal distention.  Endocrine: Negative.   Genitourinary: Negative.  Negative for dysuria, decreased urine volume, scrotal swelling and testicular pain.  Musculoskeletal: Negative for myalgias, back pain and gait problem.  Skin: Negative.  Negative for rash.  Neurological: Positive for syncope. Negative for dizziness, tremors, weakness and headaches.  Psychiatric/Behavioral: Negative for suicidal ideas, hallucinations and self-injury. The patient is not nervous/anxious.       Allergies  Ace inhibitors and Peanut-containing drug products  Home Medications   Prior to Admission medications   Medication Sig Start Date End Date Taking? Authorizing Provider  aspirin 325 MG tablet Take 325 mg by mouth daily.     Yes Historical Provider, MD  diltiazem (CARDIZEM CD) 240 MG 24 hr capsule Take 1 capsule (240 mg total) by mouth daily. 01/25/14  Yes Mahima Pandey, MD  donepezil (ARICEPT) 10 MG tablet Take 1 tablet (10 mg total) by mouth at bedtime. 01/25/14  Yes Mahima Bubba Camp, MD  furosemide (LASIX) 20 MG tablet Take 1 tablet (20 mg total) by mouth daily as needed. Patient taking differently: Take 20 mg by mouth daily as needed for fluid.  06/21/14  Yes Mahima Bubba Camp, MD  gabapentin (NEURONTIN) 100 MG capsule Take 100 mg by mouth 2 (two) times daily.   Yes Historical Provider, MD  levothyroxine (SYNTHROID, LEVOTHROID) 75 MCG tablet Take 75 mcg by mouth daily before breakfast.   Yes Historical Provider, MD  losartan (COZAAR) 50 MG tablet Take 1  tablet (50 mg total) by mouth daily. 03/03/14  Yes Mihai Croitoru, MD  Melatonin 5 MG TABS Take 5 mg by mouth at bedtime.    Yes Historical Provider, MD  metFORMIN (GLUCOPHAGE) 500 MG tablet Take 500 mg by mouth daily with breakfast.   Yes Historical Provider, MD  mirtazapine (REMERON) 30 MG tablet Take 1 tablet (30 mg total) by mouth at bedtime. 01/25/14  Yes Mahima Bubba Camp, MD  niacin (NIASPAN) 1000 MG CR tablet Take 500 mg by mouth daily.    Yes Historical Provider, MD  pravastatin (PRAVACHOL) 40 MG tablet Take 1 tablet (40 mg total) by mouth daily. 02/16/14  Yes Mihai Croitoru, MD  QUEtiapine (SEROQUEL) 25 MG tablet Take 1 tablet (25 mg total) by mouth daily. 06/21/14  Yes Mahima Bubba Camp, MD  Tamsulosin HCl (FLOMAX) 0.4 MG CAPS Take 0.8 mg by mouth daily. 2 by mouth once daily   Yes Historical Provider, MD  vitamin  B-12 (CYANOCOBALAMIN) 1000 MCG tablet Take 1,000 mcg by mouth daily.    Yes Historical Provider, MD  gabapentin (NEURONTIN) 100 MG capsule TAKE TWO CAPSULES TWICE DAILY FOR PAIN IN FEET Patient taking differently: 1 by mouth twice daily 12/30/13   Lauree Chandler, NP  glucose blood test strip One Touch Ultra Blue Test Strip. Use strip to check glucose twice daily. Dx. E11.9 04/29/14   Tiffany L Reed, DO  levothyroxine (SYNTHROID, LEVOTHROID) 75 MCG tablet TAKE ONE TABLET BY MOUTH ONCE DAILY BEFORE BREAKFAST 12/30/13   Lauree Chandler, NP  metFORMIN (GLUCOPHAGE) 500 MG tablet TAKE ONE TABLET DAILY FOR DIABETES 01/31/14   Lauree Chandler, NP   BP 111/55 mmHg  Pulse 72  Temp(Src) 97.9 F (36.6 C) (Oral)  Resp 16  Ht 5\' 9"  (1.753 m)  Wt 177 lb 11.2 oz (80.604 kg)  BMI 26.23 kg/m2  SpO2 99% Physical Exam  Constitutional: He appears well-developed and well-nourished. No distress.  HENT:  Head: Normocephalic and atraumatic.  Right Ear: External ear normal.  Left Ear: External ear normal.  Nose: Nose normal.  Mouth/Throat: Oropharynx is clear and moist.  Eyes: Conjunctivae and EOM  are normal. Pupils are equal, round, and reactive to light. No scleral icterus.  Neck: Normal range of motion. Neck supple. No JVD present. No tracheal deviation present. No thyromegaly present.  Cardiovascular: Normal rate and intact distal pulses.  Exam reveals no gallop and no friction rub.   No murmur heard. Pulmonary/Chest: Effort normal and breath sounds normal. No stridor. No respiratory distress. He has no wheezes. He has no rales.  Abdominal: Soft. He exhibits no distension. There is no tenderness. There is no rebound and no guarding.  Musculoskeletal: Normal range of motion. He exhibits no edema or tenderness.  Neurological: He is alert. No cranial nerve deficit. He exhibits normal muscle tone. Coordination normal.  Oriented to person and place but not to time (baseline per daughter). GCS 15  Skin: Skin is warm and dry. No rash noted. He is not diaphoretic.  Psychiatric: He has a normal mood and affect. His behavior is normal.  Nursing note and vitals reviewed.   ED Course  Procedures (including critical care time) Labs Review Labs Reviewed  CBC WITH DIFFERENTIAL - Abnormal; Notable for the following:    RBC 4.15 (*)    HCT 38.6 (*)    All other components within normal limits  BASIC METABOLIC PANEL - Abnormal; Notable for the following:    Glucose, Bld 144 (*)    GFR calc non Af Amer 52 (*)    GFR calc Af Amer 60 (*)    All other components within normal limits  BRAIN NATRIURETIC PEPTIDE - Abnormal; Notable for the following:    B Natriuretic Peptide 124.8 (*)    All other components within normal limits  URINALYSIS, ROUTINE W REFLEX MICROSCOPIC  I-STAT TROPOININ, ED    Imaging Review Dg Chest Port 1 View  06/21/2014   CLINICAL DATA:  Syncope, lower back pain  EXAM: PORTABLE CHEST - 1 VIEW  COMPARISON:  07/22/2012  FINDINGS: Cardiomediastinal silhouette is stable. Again noted status post CABG. No segmental infiltrate or pulmonary edema. Minimal interstitial prominence  bilaterally.  IMPRESSION: No segmental infiltrate or pulmonary edema.  Status post CABG.   Electronically Signed   By: Lahoma Crocker M.D.   On: 06/21/2014 20:10     EKG Interpretation None      MDM   Final diagnoses:  Syncope  Hypotension,  unspecified hypotension type  Bradycardia    The patient is a 80 y.o. M with CAD s/p CABD and CHF who presents after a syncopal episode while eating dinner. Per daughter the patient was eating when he stopped chewing and slumped forward in his chair. He did not fall to the ground or hit his head in any way. Per EMS he was hypotensive to SBP of 60's and bradycardic to 40's although this improved by arrival to the ED. Patients BP improves with 1L ns in the ED and HR normalizes. Initial troponin negative and EKG unremarkable. Patinent admitted to hospitalist with cards following on.  Patient seen with attending, Dr. Regenia Skeeter, who oversaw clinical decision making.     Margaretann Loveless, MD 06/22/14 0040  Ephraim Hamburger, MD 06/30/14 [phone]

## 2014-06-21 NOTE — H&P (Addendum)
[facility] History and Physical  Trung Wenzl FKC:[id number] DOB: 11/03/1927 DOA: 06/21/2014  Referring physician: Dr Rhina Brackett - [facility] PCP: [name], MD   Chief Complaint: near syncope.   HPI: Nathaniel Lopez is a 80 y.o. male  Level 5 Caveat - pt demented. History provided by family who was present for the episode.   Per family. Pt was eating dinner when he slumped over and became unresponsive. Pt did not fall and there was no head trauma. PT slowly came around w/ stimulation by family. It took 15 min for pt to return to baseline. PTs family report that pt was rigid at this time w/ a fixed gaze. Always w/ pulse. Family reported that this happened 4 times during this 15 min period. Episodes would las a couple minutes and would then be groggy and out of it after each episode. EMS called and pt was noted to have a HR in th3 40s and SBP of 60. Pt given 1L NS by EMS w/ improvement in VS. Initial troponin and EKG nml  Stays at home w/ family. Reports being happily demented. Mentation nml at time of admission  Review of Systems:  Demented - per HPI by fam,ily  Past Medical History  Diagnosis Date  . CHF (congestive heart failure)     diastolic dysfunction  . Atrial fibrillation     permanent  . Colon cancer   . Carotid arterial disease   . COPD (chronic obstructive pulmonary disease)     moderate 2006 by PFT FEV1 1.39  . Hyperlipidemia   . Type 2 diabetes mellitus   . Hypertension   . Hypothyroid   . Pancreatic cyst 12/14/2011    MRI - stable cyst lesion, likely benign  . Myocardial infarct   . Coronary artery disease   . Prostatitis   . Gallstones   . TIA (transient ischemic attack)   . Tremor   . Carotid bruit 12/17/2010    doppler - ICAs normal patency w/o evidence of significant diameter reduction/dissection; R ECA turbulent flowthroughout, possible source of bruit  . Cerebral atherosclerosis 11/10/2009    doppler - R bulb and proximal ICA 50-69% diameter reduction; R  ECA high grade stenosis; L ICA 0-49% diameter reduction  . Carotid bruit 10/21/2006    doppler - R bulb and proximal ICA 50-69% diameter reduction, R ECA high grade stenosis; L ICA 0-49% diameter reduction  . Dementia   . Incomplete bladder emptying   . Acute pyelonephritis without lesion of renal medullary necrosis   . Full incontinence of feces   . Other specified disease of pancreas   . Unspecified disorder of prostate   . Herpes zoster without mention of complication   . Pain in joint, pelvic region and thigh   . Other vitamin B12 deficiency anemia   . Vascular dementia, uncomplicated   . Muscle weakness (generalized)   . Other specified disorder of skin   . Depression   . Coronary atherosclerosis of unspecified type of vessel, native or graft    Past Surgical History  Procedure Laterality Date  . Hernia repair  2003  . Cholecystectomy  1990  . Coronary artery bypass graft  2006    LIMA to LAD; RSV to ICA; RSV to posterior descending CA  . Colon surgery  1989  . Cataract extraction Right 2006  . Tee with cardioversion  04/16/2010    EF 55-60%; LA mod dialted; AF precludes elval of LV diastolic fcn; septal motion consistent w/ post-thoracotomy state  .  Cardiac catheterization  12/03/2004    total occulsion of LAD w/ retrograde filing, total occlusion RCA w/ l to r collaterals, 90% proximal optional diagonal disease; normal LV systolic fcn, no renal artery stenosis;   . Colonoscopy  01/27/2008    Dr.Magod, polyps  . Colonoscopy  03/02/2009    Dr.Magod, adenomatous and hyperplastic polyps no malignancy    Social History:  reports that he quit smoking about 46 years ago. His smoking use included Cigarettes. He has a 40 pack-year smoking history. He has never used smokeless tobacco. He reports that he drinks about 0.6 oz of alcohol per week. He reports that he does not use illicit drugs.  Allergies  Allergen Reactions  . Ace Inhibitors Cough  . Peanut-Containing Drug Products      REACTION: cough    Family History  Problem Relation Age of Onset  . Heart attack Mother   . Breast cancer Mother   . Heart disease Mother 27    myocardiomyopathy  . Alzheimer's disease Sister      Prior to Admission medications   Medication Sig Start Date End Date Taking? Authorizing Provider  aspirin 325 MG tablet Take 325 mg by mouth daily.      Historical Provider, MD  diltiazem (CARDIZEM CD) 240 MG 24 hr capsule Take 1 capsule (240 mg total) by mouth daily. 01/25/14   [name], MD  donepezil (ARICEPT) 10 MG tablet Take 1 tablet (10 mg total) by mouth at bedtime. 01/25/14   [name], MD  furosemide (LASIX) 20 MG tablet Take 1 tablet (20 mg total) by mouth daily as needed. 06/21/14   [name], MD  gabapentin (NEURONTIN) 100 MG capsule TAKE TWO CAPSULES TWICE DAILY FOR PAIN IN FEET Patient taking differently: 1 by mouth twice daily 12/30/13   Lauree Chandler, NP  glucose blood test strip One Touch Ultra Blue Test Strip. Use strip to check glucose twice daily. Dx. E11.9 04/29/14   Tiffany L Reed, DO  levothyroxine (SYNTHROID, LEVOTHROID) 75 MCG tablet TAKE ONE TABLET BY MOUTH ONCE DAILY BEFORE BREAKFAST 12/30/13   Lauree Chandler, NP  losartan (COZAAR) 50 MG tablet Take 1 tablet (50 mg total) by mouth daily. 03/03/14   Mihai Croitoru, MD  Melatonin 5 MG TABS Take 5 mg by mouth at bedtime.     Historical Provider, MD  metFORMIN (GLUCOPHAGE) 500 MG tablet TAKE ONE TABLET DAILY FOR DIABETES 01/31/14   Lauree Chandler, NP  mirtazapine (REMERON) 30 MG tablet Take 1 tablet (30 mg total) by mouth at bedtime. 01/25/14   [name], MD  niacin (NIASPAN) 1000 MG CR tablet Take 500 mg by mouth daily.     Historical Provider, MD  pravastatin (PRAVACHOL) 40 MG tablet Take 1 tablet (40 mg total) by mouth daily. 02/16/14   Mihai Croitoru, MD  QUEtiapine (SEROQUEL) 25 MG tablet Take 1 tablet (25 mg total) by mouth daily. 06/21/14   [name], MD  Tamsulosin HCl (FLOMAX) 0.4 MG CAPS Take  0.4 mg by mouth. 2 by mouth once daily    Historical Provider, MD  vitamin B-12 (CYANOCOBALAMIN) 1000 MCG tablet Take 1,000 mcg by mouth daily.     Historical Provider, MD   Physical Exam: Filed Vitals:   06/21/14 2145 06/21/14 2219 06/21/14 2230 06/21/14 2301  BP: 104/55 107/56 108/54 111/55  Pulse: 57 75 63 72  Temp:  98.7 F (37.1 C)  97.9 F (36.6 C)  TempSrc:    Oral  Resp: 14 18  16 16  Height:    5\' 9"  (1.753 m)  Weight:    80.604 kg (177 lb 11.2 oz)  SpO2: 99% 98% 98% 99%    Wt Readings from Last 3 Encounters:  06/21/14 80.604 kg (177 lb 11.2 oz)  06/21/14 79.379 kg (175 lb)  03/10/14 82.555 kg (182 lb)    General:  Appears calm and comfortable Eyes:  PERRL, normal lids, irises & conjunctiva ENT:  grossly normal hearing, lips & tongue Neck:  no LAD, masses or thyromegaly Cardiovascular:  RRR, no m/r/g. No LE edema. Telemetry:  SR, no arrhythmias  Respiratory:  CTA bilaterally, no w/r/r. Normal respiratory effort. Abdomen:  soft, ntnd Skin:  no rash or induration seen on limited exam Musculoskeletal:  grossly normal tone BUE/BLE Psychiatric: AAO to person and place Neurologic:  grossly non-focal.          Labs on Admission:  Basic Metabolic Panel:  Recent Labs Lab 06/21/14 1950  NA 139  K 3.5  CL 107  CO2 21  GLUCOSE 144*  BUN 20  CREATININE 1.24  CALCIUM 8.7   Liver Function Tests: No results for input(s): AST, ALT, ALKPHOS, BILITOT, PROT, ALBUMIN in the last 168 hours. No results for input(s): LIPASE, AMYLASE in the last 168 hours. No results for input(s): AMMONIA in the last 168 hours. CBC:  Recent Labs Lab 06/21/14 1950  WBC 6.7  NEUTROABS 3.8  HGB 13.1  HCT 38.6*  MCV 93.0  PLT 238   Cardiac Enzymes: No results for input(s): CKTOTAL, CKMB, CKMBINDEX, TROPONINI in the last 168 hours.  BNP (last 3 results) No results for input(s): PROBNP in the last 8760 hours. CBG: No results for input(s): GLUCAP in the last 168  hours.  Radiological Exams on Admission: Dg Chest Port 1 View  06/21/2014   CLINICAL DATA:  Syncope, lower back pain  EXAM: PORTABLE CHEST - 1 VIEW  COMPARISON:  07/22/2012  FINDINGS: Cardiomediastinal silhouette is stable. Again noted status post CABG. No segmental infiltrate or pulmonary edema. Minimal interstitial prominence bilaterally.  IMPRESSION: No segmental infiltrate or pulmonary edema.  Status post CABG.   Electronically Signed   By: Lahoma Crocker M.D.   On: 06/21/2014 20:10    EKG: pending  Assessment/Plan Principal Problem:   Near syncope Active Problems:   HLD (hyperlipidemia)   Atrial fibrillation   Chronic diastolic heart failure   COPE (chronic obstructive pulmonary emphysema)   CAD (coronary artery disease) s/p CABG 2006   DM type 2 with diabetic peripheral neuropathy   Bradycardia   Alzheimer's dementia with behavioral disturbance   Dysphagia   CKD (chronic kidney disease) stage 3, GFR 30-59 ml/min  Near syncope: likely related to arrhythmia, possible vagal episode from food becoming lodged in throat.  Episodes sound like abscence seizures but unlikely . Cardiology consulted and to see pt. H/o Afib. No EKG on admission. No sign of stroke or chemical imbalance, or medication induced symptoms. H/o Dysphagia.  -Admit - Tele - EKG - f/u Cardiology consult tonight - orthostatics - NPO until passes bedside swallow  Diastolic CHF: no acute exacerbation. Echo 12/2012 EF 50-55% . BNP 124.  - continue lasix prn - EKG  CKD: Cr 1.24. Baseline 1.1.  - NS 2ml/hr - BMET in am  DM: last A1c 6.4 (pre-dm).  - hold metformin - SSI  Dementia: At baseline per family. Pt will say things that sound like he knows what is going on but does not per family.  - Continue Aricept  (  consider DC after hospitalization as likely of no benfit) - continue seroquel  Hypertention: - hole home Losartan - continue dilt in the am for AFib rate control  Neuropathy:  - continue  Neurontin  Hypothyroidism: - continue synthroid  BPH : - continue Flomax  Code Status: DNR DVT Prophylaxis: Hep Family Communication: Son and daughter Disposition Plan: Pending improvement   Darlinda Bellows, [facility] [email or web address] Password [email or web address]

## 2014-06-22 ENCOUNTER — Inpatient Hospital Stay (HOSPITAL_COMMUNITY): Payer: Medicare Other

## 2014-06-22 DIAGNOSIS — R001 Bradycardia, unspecified: Secondary | ICD-10-CM

## 2014-06-22 DIAGNOSIS — R55 Syncope and collapse: Secondary | ICD-10-CM | POA: Insufficient documentation

## 2014-06-22 DIAGNOSIS — I959 Hypotension, unspecified: Secondary | ICD-10-CM | POA: Diagnosis present

## 2014-06-22 DIAGNOSIS — N183 Chronic kidney disease, stage 3 unspecified: Secondary | ICD-10-CM | POA: Diagnosis present

## 2014-06-22 DIAGNOSIS — I482 Chronic atrial fibrillation: Secondary | ICD-10-CM

## 2014-06-22 LAB — BASIC METABOLIC PANEL
Anion gap: 4 — ABNORMAL LOW (ref 5–15)
BUN: 19 mg/dL (ref 6–23)
CALCIUM: 8.5 mg/dL (ref 8.4–10.5)
CO2: 27 mmol/L (ref 19–32)
Chloride: 109 mEq/L (ref 96–112)
Creatinine, Ser: 1.29 mg/dL (ref 0.50–1.35)
GFR, EST AFRICAN AMERICAN: 57 mL/min — AB (ref >90)
GFR, EST NON AFRICAN AMERICAN: 49 mL/min — AB (ref >90)
GLUCOSE: 126 mg/dL — AB (ref 70–99)
POTASSIUM: 3.7 mmol/L (ref 3.5–5.1)
Sodium: 140 mmol/L (ref 135–145)

## 2014-06-22 LAB — CBC
HCT: 35.8 % — ABNORMAL LOW (ref 39.0–52.0)
HEMOGLOBIN: 11.9 g/dL — AB (ref 13.0–17.0)
MCH: 30.4 pg (ref 26.0–34.0)
MCHC: 33.2 g/dL (ref 30.0–36.0)
MCV: 91.6 fL (ref 78.0–100.0)
Platelets: 210 10*3/uL (ref 150–400)
RBC: 3.91 MIL/uL — ABNORMAL LOW (ref 4.22–5.81)
RDW: 13.9 % (ref 11.5–15.5)
WBC: 6.6 10*3/uL (ref 4.0–10.5)

## 2014-06-22 LAB — HEMOGLOBIN A1C
Est. average glucose Bld gHb Est-mCnc: 131 mg/dL
HEMOGLOBIN A1C: 6.2 % — AB (ref 4.8–5.6)

## 2014-06-22 LAB — GLUCOSE, CAPILLARY
GLUCOSE-CAPILLARY: 108 mg/dL — AB (ref 70–99)
Glucose-Capillary: 95 mg/dL (ref 70–99)
Glucose-Capillary: 130 mg/dL — ABNORMAL HIGH (ref 70–99)

## 2014-06-22 LAB — TSH: TSH: 0.873 u[IU]/mL (ref 0.350–4.500)

## 2014-06-22 MED ORDER — ONDANSETRON HCL 4 MG/2ML IJ SOLN
4.0000 mg | Freq: Four times a day (QID) | INTRAMUSCULAR | Status: DC | PRN
  Filled 2014-06-22: qty 2

## 2014-06-22 MED ORDER — SODIUM CHLORIDE 0.9 % IJ SOLN
3.0000 mL | Freq: Two times a day (BID) | INTRAMUSCULAR | Status: DC
  Administered 2014-06-22 – 2014-06-25 (×7): 3 mL via INTRAVENOUS

## 2014-06-22 MED ORDER — HEPARIN SODIUM (PORCINE) 5000 UNIT/ML IJ SOLN
5000.0000 [IU] | Freq: Three times a day (TID) | INTRAMUSCULAR | Status: DC
  Administered 2014-06-22 – 2014-06-25 (×10): 5000 [IU] via SUBCUTANEOUS
  Filled 2014-06-22 (×9): qty 1

## 2014-06-22 MED ORDER — DILTIAZEM HCL ER COATED BEADS 240 MG PO CP24: 240.0000 mg | ORAL_CAPSULE | Freq: Every day | ORAL | Status: DC

## 2014-06-22 MED ORDER — INSULIN ASPART 100 UNIT/ML ~~LOC~~ SOLN
0.0000 [IU] | Freq: Three times a day (TID) | SUBCUTANEOUS | Status: DC
  Administered 2014-06-23: 1 [IU] via SUBCUTANEOUS
  Administered 2014-06-23: 2 [IU] via SUBCUTANEOUS
  Administered 2014-06-23: 3 [IU] via SUBCUTANEOUS
  Administered 2014-06-24 (×2): 1 [IU] via SUBCUTANEOUS

## 2014-06-22 MED ORDER — METHYLPREDNISOLONE SODIUM SUCC 125 MG IJ SOLR
80.0000 mg | INTRAMUSCULAR | Status: AC
  Administered 2014-06-22: 80 mg via INTRAVENOUS
  Filled 2014-06-22: qty 2

## 2014-06-22 MED ORDER — ACETAMINOPHEN 325 MG PO TABS: 650.0000 mg | ORAL_TABLET | Freq: Four times a day (QID) | ORAL | Status: DC | PRN

## 2014-06-22 MED ORDER — DONEPEZIL HCL 10 MG PO TABS
10.0000 mg | ORAL_TABLET | Freq: Every day | ORAL | Status: DC
  Administered 2014-06-22 – 2014-06-24 (×3): 10 mg via ORAL
  Filled 2014-06-22 (×3): qty 1

## 2014-06-22 MED ORDER — ASPIRIN 325 MG PO TABS
325.0000 mg | ORAL_TABLET | Freq: Every day | ORAL | Status: DC
  Administered 2014-06-22 – 2014-06-23 (×2): 325 mg via ORAL
  Filled 2014-06-22 (×2): qty 1

## 2014-06-22 MED ORDER — TAMSULOSIN HCL 0.4 MG PO CAPS
0.8000 mg | ORAL_CAPSULE | Freq: Every day | ORAL | Status: DC
  Administered 2014-06-22 – 2014-06-24 (×3): 0.8 mg via ORAL
  Filled 2014-06-22 (×4): qty 2

## 2014-06-22 MED ORDER — FUROSEMIDE 20 MG PO TABS: 20.0000 mg | ORAL_TABLET | Freq: Every day | ORAL | Status: DC | PRN

## 2014-06-22 MED ORDER — ALBUTEROL SULFATE (2.5 MG/3ML) 0.083% IN NEBU
2.5000 mg | INHALATION_SOLUTION | RESPIRATORY_TRACT | Status: AC
  Administered 2014-06-22: 2.5 mg via RESPIRATORY_TRACT
  Filled 2014-06-22: qty 3

## 2014-06-22 MED ORDER — QUETIAPINE FUMARATE 25 MG PO TABS
25.0000 mg | ORAL_TABLET | Freq: Every day | ORAL | Status: DC
  Administered 2014-06-22 – 2014-06-25 (×4): 25 mg via ORAL
  Filled 2014-06-22 (×4): qty 1

## 2014-06-22 MED ORDER — SENNA 8.6 MG PO TABS
1.0000 | ORAL_TABLET | Freq: Two times a day (BID) | ORAL | Status: DC
  Administered 2014-06-22 – 2014-06-23 (×2): 8.6 mg via ORAL
  Filled 2014-06-22 (×3): qty 1

## 2014-06-22 MED ORDER — ALBUTEROL SULFATE (2.5 MG/3ML) 0.083% IN NEBU: 2.5000 mg | INHALATION_SOLUTION | RESPIRATORY_TRACT | Status: DC | PRN

## 2014-06-22 MED ORDER — ONDANSETRON HCL 4 MG PO TABS: 4.0000 mg | ORAL_TABLET | Freq: Four times a day (QID) | ORAL | Status: DC | PRN

## 2014-06-22 MED ORDER — METOPROLOL TARTRATE 1 MG/ML IV SOLN: 5.0000 mg | INTRAVENOUS | Status: DC | PRN

## 2014-06-22 MED ORDER — GABAPENTIN 100 MG PO CAPS
100.0000 mg | ORAL_CAPSULE | Freq: Two times a day (BID) | ORAL | Status: DC
  Administered 2014-06-22 – 2014-06-25 (×7): 100 mg via ORAL
  Filled 2014-06-22 (×7): qty 1

## 2014-06-22 MED ORDER — SODIUM CHLORIDE 0.9 % IV SOLN
INTRAVENOUS | Status: DC
  Administered 2014-06-22: 06:00:00 via INTRAVENOUS

## 2014-06-22 MED ORDER — ACETAMINOPHEN 650 MG RE SUPP: 650.0000 mg | Freq: Four times a day (QID) | RECTAL | Status: DC | PRN

## 2014-06-22 MED ORDER — LEVALBUTEROL HCL 0.63 MG/3ML IN NEBU
0.6300 mg | INHALATION_SOLUTION | Freq: Four times a day (QID) | RESPIRATORY_TRACT | Status: DC | PRN
  Administered 2014-06-23: 0.63 mg via RESPIRATORY_TRACT
  Filled 2014-06-22: qty 3

## 2014-06-22 MED ORDER — MIRTAZAPINE 7.5 MG PO TABS
30.0000 mg | ORAL_TABLET | Freq: Every day | ORAL | Status: DC
  Administered 2014-06-22 – 2014-06-24 (×3): 30 mg via ORAL
  Filled 2014-06-22 (×4): qty 4

## 2014-06-22 NOTE — Procedures (Signed)
ELECTROENCEPHALOGRAM REPORT  Patient: Nathaniel Lopez       Room #: 5W38 EEG No. ID: [id number] Age: 80 y.o.        Sex: male Referring Physician: [facility], Mamie Nick Report Date:  06/22/2014        Interpreting Physician: Anthony Sar  History: Nathaniel Lopez is an 80 y.o. male admitted following recurrent spells of unresponsiveness with generalized stiffness of extremities associated with bradycardia.  Indications for study:  Rule out new onset seizure disorder.  Technique: This is an 18 channel routine scalp EEG performed at the bedside with bipolar and monopolar montages arranged in accordance to the international 10/20 system of electrode placement.   Description: This EEG recording was performed during wakefulness and during sleep. Predominant background activity during wakefulness consisted of 10 Hz symmetrical alpha rhythm which attenuated well with eye-opening. Photic stimulation produced symmetrical occipital driving response. Hyperventilation was not performed. There was generalized slowing of background activity symmetrically during sleep, with normal symmetrical vertex waves, sleep spindles and K-complexes during stage II of sleep. No epileptiform discharges are recorded during wakefulness nor during sleep. There was no abnormal slowing of cerebral activity.  Interpretation: This is a normal EEG recording during wakefulness and during sleep. No evidence of an epileptic disorder was demonstrated.    Rush Farmer M.D. [facility] [phone]

## 2014-06-22 NOTE — Evaluation (Signed)
Clinical/Bedside Swallow Evaluation Patient Details  Name: Nathaniel Lopez MRN: [id number] Date of Birth: 1928-05-31  Today's Date: 06/22/2014 Time: 0835-0900 SLP Time Calculation (min) (ACUTE ONLY): 25 min  Past Medical History:  Past Medical History  Diagnosis Date  . CHF (congestive heart failure)     diastolic dysfunction  . Atrial fibrillation     permanent  . Colon cancer   . Carotid arterial disease   . COPD (chronic obstructive pulmonary disease)     moderate 2006 by PFT FEV1 1.39  . Hyperlipidemia   . Type 2 diabetes mellitus   . Hypertension   . Hypothyroid   . Pancreatic cyst 12/14/2011    MRI - stable cyst lesion, likely benign  . Myocardial infarct   . Coronary artery disease   . Prostatitis   . Gallstones   . TIA (transient ischemic attack)   . Tremor   . Carotid bruit 12/17/2010    doppler - ICAs normal patency w/o evidence of significant diameter reduction/dissection; R ECA turbulent flowthroughout, possible source of bruit  . Cerebral atherosclerosis 11/10/2009    doppler - R bulb and proximal ICA 50-69% diameter reduction; R ECA high grade stenosis; L ICA 0-49% diameter reduction  . Carotid bruit 10/21/2006    doppler - R bulb and proximal ICA 50-69% diameter reduction, R ECA high grade stenosis; L ICA 0-49% diameter reduction  . Dementia   . Incomplete bladder emptying   . Acute pyelonephritis without lesion of renal medullary necrosis   . Full incontinence of feces   . Other specified disease of pancreas   . Unspecified disorder of prostate   . Herpes zoster without mention of complication   . Pain in joint, pelvic region and thigh   . Other vitamin B12 deficiency anemia   . Vascular dementia, uncomplicated   . Muscle weakness (generalized)   . Other specified disorder of skin   . Depression   . Coronary atherosclerosis of unspecified type of vessel, native or graft    Past Surgical History:  Past Surgical History  Procedure Laterality Date  .  Hernia repair  2003  . Cholecystectomy  1990  . Coronary artery bypass graft  2006    LIMA to LAD; RSV to ICA; RSV to posterior descending CA  . Colon surgery  1989  . Cataract extraction Right 2006  . Tee with cardioversion  04/16/2010    EF 55-60%; LA mod dialted; AF precludes elval of LV diastolic fcn; septal motion consistent w/ post-thoracotomy state  . Cardiac catheterization  12/03/2004    total occulsion of LAD w/ retrograde filing, total occlusion RCA w/ l to r collaterals, 90% proximal optional diagonal disease; normal LV systolic fcn, no renal artery stenosis;   . Colonoscopy  01/27/2008    Dr.Magod, polyps  . Colonoscopy  03/02/2009    Dr.Magod, adenomatous and hyperplastic polyps no malignancy    HPI:  Pt is an 80 yo male who was eating dinner when he slumped over and became unresponsive on 06/21/14. Per family, pt did not fall and there was no head trauma. Pt slowly came around w/ stimulation by family. It took 15 min for pt to return to baseline. Pt's family report that pt was rigid at this time w/ a fixed gaze. Family reported that this happened 4 times during this 15 min period. Episodes would last a couple minutes and would then be groggy and out of it after each episode. EMS called and pt was noted to have a  HR in th3 40s and SBP of 60. Pt given 1L NS by EMS w/ improvement in VS. Initial troponin and EKG nml; Pt has hx of dysphagia per family, but last MBS completed in 2009 indicated a typical swallow for age; family reports coughing with meals due to taking large bites and being impulsive during meals.   Assessment / Plan / Recommendation Clinical Impression   Pt with overt s/s of aspiration noted with larger sips of thin liquids including wet vocal quality after successive swallows, but improved with double swallows; pt also exhibited a delayed throat clearing with larger sips, but improved with double swallows as well and elimination of larger sips; small sips improved overall  vocal quality, but double swallows required intermittently; family reports impulsivity with meals and "needing cues to swallow" during meals; swallow recommendations were Dysphagia 3 diet with thin, small sips/no straw and double swallow prn; ST to f/u for diet tolerance with recommended diet    Aspiration Risk  Mild    Diet Recommendation Dysphagia 3 (Mechanical Soft);Thin liquid   Liquid Administration via: Cup;No straw Medication Administration: Whole meds with liquid (as tolerated) Supervision: Full supervision/cueing for compensatory strategies Compensations: Slow rate;Small sips/bites;Effortful swallow Postural Changes and/or Swallow Maneuvers: Seated upright 90 degrees    Other  Recommendations     Follow Up Recommendations  Other (comment) (diet tolerance )    Frequency and Duration min 2x/week  1 week   Pertinent Vitals/Pain Afib intermittently; afebrile    SLP Swallow Goals  See POC   Swallow Study Prior Functional Status   Lives with family; modified dependent with ADL's    General Date of Onset: 06/21/14 HPI: Pt is an 80 yo male who was eating dinner when he slumped over and became unresponsive on 06/21/14. Per family, pt did not fall and there was no head trauma. Pt slowly came around w/ stimulation by family. It took 15 min for pt to return to baseline. Pt's family report that pt was rigid at this time w/ a fixed gaze. Family reported that this happened 4 times during this 15 min period. Episodes would last a couple minutes and would then be groggy and out of it after each episode. EMS called and pt was noted to have a HR in th3 40s and SBP of 60. Pt given 1L NS by EMS w/ improvement in VS. Initial troponin and EKG nml; Pt has hx of dysphagia per family, but last MBS completed in 2009 indicated a typical swallow for age; family reports coughing with meals due to taking large bites and being impulsive during meals. Type of Study: Bedside swallow evaluation Previous  Swallow Assessment: MBS in 2009 indicating typical swallow for age Diet Prior to this Study: NPO Temperature Spikes Noted: No Respiratory Status: Room air Behavior/Cognition: Alert;Cooperative;Requires cueing;Hard of hearing Oral Cavity - Dentition: Dentures, top;Dentures, bottom Self-Feeding Abilities: Able to feed self;Needs assist Patient Positioning: Upright in bed Baseline Vocal Quality: Clear;Low vocal intensity Volitional Cough: Strong Volitional Swallow: Able to elicit    Oral/Motor/Sensory Function Overall Oral Motor/Sensory Function: Appears within functional limits for tasks assessed   Ice Chips Ice chips: Not tested   Thin Liquid Thin Liquid: Impaired Presentation: Cup;Spoon Pharyngeal  Phase Impairments: Multiple swallows;Throat Clearing - Delayed    Nectar Thick Nectar Thick Liquid: Not tested   Honey Thick Honey Thick Liquid: Not tested   Puree Puree: Within functional limits Presentation: Spoon   Solid   GO    Solid: Impaired Presentation: Spoon Oral Phase  Impairments: Impaired mastication Oral Phase Functional Implications: Oral residue;Other (comment) (cleared with double swallow; alternating solids/liquids) Pharyngeal Phase Impairments: Multiple swallows;Throat Clearing - Delayed;Other (comments) (improved with double swallow)       Nevena Rozenberg,PAT 06/22/2014,10:22 AM

## 2014-06-22 NOTE — Progress Notes (Signed)
Patient Demographics  Nathaniel Lopez, is a 80 y.o. male, DOB - 1927-08-21, FOY:[id number]  Admit date - 06/21/2014   Admitting Physician Waldemar Dickens, MD  Outpatient Primary MD for the patient is [name], MD  LOS - 1   Chief Complaint  Patient presents with  . Near Syncope  . Hypotension        Subjective:   Nathaniel Lopez today has, No headache, No chest pain, No abdominal pain - No Nausea, No new weakness tingling or numbness, No Cough - SOB.    Assessment & Plan    1. Syncope and Collapse - ? Due to bradycardia and hypotension per EMS, offending medications held, blood pressure stable, symptom-free, no focal deficits. However these episodes have happened intermittently in the past few times also per family. CT head unremarkable, EEG MRI brain ordered and requested neuro to evaluate. Check echogram as well, Cardiology following.  2. History of atrial fibrillation. Currently rate controlled, diltiazem held due to possible bradycardia at home with hypotension, as needed IV Lopressor, poor candidate for anticoagulation due to high fall risk and risk of bleeding. Monitor on telemetry. Check TSH.   3. Dysphagia. Speech following. Currently on dysphagia 3 diet.   4. Dementia. Currently on Aricept, at risk for delirium, continue Remeron and set a quit.   5. BPH. Continue Flomax and monitor. Monitor blood pressures.   6. DM type II with diabetic neuropathy. Currently on sliding scale, continue Neurontin, monitor CBGs and even see.  Lab Results  Component Value Date   HGBA1C 6.2* 06/21/2014    CBG (last 3)   Recent Labs  06/22/14 0729  GLUCAP 108*       Code Status: DNR  Family Communication: son and daughter in law  Disposition Plan: TBD   Procedures  echogram, MRI  brain, CT head   Consults  Neuro, cards   Medications  Scheduled Meds: . aspirin  325 mg Oral Daily  . donepezil  10 mg Oral QHS  . gabapentin  100 mg Oral BID  . heparin  5,000 Units Subcutaneous 3 times per day  . insulin aspart  0-9 Units Subcutaneous TID WC  . mirtazapine  30 mg Oral QHS  . QUEtiapine  25 mg Oral Daily  . senna  1 tablet Oral BID  . sodium chloride  3 mL Intravenous Q12H  . tamsulosin  0.8 mg Oral Daily   Continuous Infusions: . sodium chloride 75 mL/hr at 06/22/14 0547   PRN Meds:.acetaminophen **OR** acetaminophen, metoprolol, ondansetron **OR** ondansetron (ZOFRAN) IV  DVT Prophylaxis     Lab Results  Component Value Date   PLT 210 06/22/2014    Antibiotics     Anti-infectives    None          Objective:   Filed Vitals:   06/21/14 2219 06/21/14 2230 06/21/14 2301 06/22/14 0500  BP: 107/56 108/54 111/55 110/61  Pulse: 75 63 72 73  Temp: 98.7 F (37.1 C)  97.9 F (36.6 C) 98.5 F (36.9 C)  TempSrc:   Oral Oral  Resp: 18 16 16 16   Height:   5\' 9"  (1.753 m)   Weight:   80.604 kg (177 lb 11.2 oz)   SpO2: 98% 98% 99% 97%  Wt Readings from Last 3 Encounters:  06/21/14 80.604 kg (177 lb 11.2 oz)  06/21/14 79.379 kg (175 lb)  03/10/14 82.555 kg (182 lb)     Intake/Output Summary (Last 24 hours) at 06/22/14 1031 Last data filed at 06/22/14 0659  Gross per 24 hour  Intake    590 ml  Output    200 ml  Net    390 ml     Physical Exam  Awake Alert, Oriented X 3, No new F.N deficits, Normal affect [place].AT,PERRAL Supple Neck,No JVD, No cervical lymphadenopathy appriciated.  Symmetrical Chest wall movement, Good air movement bilaterally, CTAB RRR,No Gallops,Rubs or new Murmurs, No Parasternal Heave +ve B.Sounds, Abd Soft, No tenderness, No organomegaly appriciated, No rebound - guarding or rigidity. No Cyanosis, Clubbing or edema, No new Rash or bruise     Data Review   Micro Results No results found for this or any  previous visit (from the past 240 hour(s)).  Radiology Reports Dg Chest Port 1 View  06/21/2014   CLINICAL DATA:  Syncope, lower back pain  EXAM: PORTABLE CHEST - 1 VIEW  COMPARISON:  07/22/2012  FINDINGS: Cardiomediastinal silhouette is stable. Again noted status post CABG. No segmental infiltrate or pulmonary edema. Minimal interstitial prominence bilaterally.  IMPRESSION: No segmental infiltrate or pulmonary edema.  Status post CABG.   Electronically Signed   By: Lahoma Crocker M.D.   On: 06/21/2014 20:10     CBC  Recent Labs Lab 06/21/14 1950 06/22/14 0500  WBC 6.7 6.6  HGB 13.1 11.9*  HCT 38.6* 35.8*  PLT 238 210  MCV 93.0 91.6  MCH 31.6 30.4  MCHC 33.9 33.2  RDW 13.8 13.9  LYMPHSABS 2.4  --   MONOABS 0.5  --   EOSABS 0.1  --   BASOSABS 0.0  --     Chemistries   Recent Labs Lab 06/21/14 1950 06/22/14 0500  NA 139 140  K 3.5 3.7  CL 107 109  CO2 21 27  GLUCOSE 144* 126*  BUN 20 19  CREATININE 1.24 1.29  CALCIUM 8.7 8.5   ------------------------------------------------------------------------------------------------------------------ estimated creatinine clearance is 42.6 mL/min (by C-G formula based on Cr of 1.29). ------------------------------------------------------------------------------------------------------------------  Recent Labs  06/21/14 1716  HGBA1C 6.2*   ------------------------------------------------------------------------------------------------------------------ No results for input(s): CHOL, HDL, LDLCALC, TRIG, CHOLHDL, LDLDIRECT in the last 72 hours. ------------------------------------------------------------------------------------------------------------------ No results for input(s): TSH, T4TOTAL, T3FREE, THYROIDAB in the last 72 hours.  Invalid input(s): FREET3 ------------------------------------------------------------------------------------------------------------------ No results for input(s): VITAMINB12, FOLATE, FERRITIN,  TIBC, IRON, RETICCTPCT in the last 72 hours.  Coagulation profile No results for input(s): INR, PROTIME in the last 168 hours.  No results for input(s): DDIMER in the last 72 hours.  Cardiac Enzymes No results for input(s): CKMB, TROPONINI, MYOGLOBIN in the last 168 hours.  Invalid input(s): CK ------------------------------------------------------------------------------------------------------------------ Invalid input(s): POCBNP     Time Spent in minutes 35   Yasmin Dibello K M.D on 06/22/2014 at 10:31 AM  Between 7am to 7pm - Pager - [phone]  After 7pm go to [email or web address] - [facility] Office  [phone]

## 2014-06-22 NOTE — Progress Notes (Signed)
Utilization review completed. Kassi Esteve, RN, BSN. 

## 2014-06-22 NOTE — Consult Note (Signed)
Neurology Consultation Reason for Consult: Syncope Referring Physician: Ronnie Derby  CC: Syncope  History is obtained from: Patient, daughter  HPI: Nathaniel Lopez is a 80 y.o. male with a history of atrial fibrillation and CHF who was in his normal state of health while eating yesterday when he suddenly felt lightheaded. His daughter states that he became glazed over and then appeared to start to pass out. When he began to start to pass out she laid him down, he regained consciousness then passed out again several times. On EMS arrival, he was severely bradycardic and hypotensive.  He had a similar episode several years ago. He had no clear seizure activity.    ROS: A 14 point ROS was performed and is negative except as noted in the HPI.   Past Medical History  Diagnosis Date  . CHF (congestive heart failure)     diastolic dysfunction  . Atrial fibrillation     permanent  . Colon cancer   . Carotid arterial disease   . COPD (chronic obstructive pulmonary disease)     moderate 2006 by PFT FEV1 1.39  . Hyperlipidemia   . Type 2 diabetes mellitus   . Hypertension   . Hypothyroid   . Pancreatic cyst 12/14/2011    MRI - stable cyst lesion, likely benign  . Myocardial infarct   . Coronary artery disease   . Prostatitis   . Gallstones   . TIA (transient ischemic attack)   . Tremor   . Carotid bruit 12/17/2010    doppler - ICAs normal patency w/o evidence of significant diameter reduction/dissection; R ECA turbulent flowthroughout, possible source of bruit  . Cerebral atherosclerosis 11/10/2009    doppler - R bulb and proximal ICA 50-69% diameter reduction; R ECA high grade stenosis; L ICA 0-49% diameter reduction  . Carotid bruit 10/21/2006    doppler - R bulb and proximal ICA 50-69% diameter reduction, R ECA high grade stenosis; L ICA 0-49% diameter reduction  . Dementia   . Incomplete bladder emptying   . Acute pyelonephritis without lesion of renal medullary necrosis   . Full  incontinence of feces   . Other specified disease of pancreas   . Unspecified disorder of prostate   . Herpes zoster without mention of complication   . Pain in joint, pelvic region and thigh   . Other vitamin B12 deficiency anemia   . Vascular dementia, uncomplicated   . Muscle weakness (generalized)   . Other specified disorder of skin   . Depression   . Coronary atherosclerosis of unspecified type of vessel, native or graft     Family History: No history of seizures  Social History: Tob: Denies  Exam: Current vital signs: BP 135/63 mmHg  Pulse 77  Temp(Src) 98.1 F (36.7 C) (Oral)  Resp 19  Ht 5\' 9"  (1.753 m)  Wt 80.604 kg (177 lb 11.2 oz)  BMI 26.23 kg/m2  SpO2 98% Vital signs in last 24 hours: Temp:  [97.5 F (36.4 C)-98.7 F (37.1 C)] 98.1 F (36.7 C) (12/23 1358) Pulse Rate:  [57-77] 77 (12/23 1358) Resp:  [14-21] 19 (12/23 1358) BP: (70-135)/(31-63) 135/63 mmHg (12/23 1358) SpO2:  [97 %-100 %] 98 % (12/23 1358) Weight:  [79.379 kg (175 lb)-80.604 kg (177 lb 11.2 oz)] 80.604 kg (177 lb 11.2 oz) (12/22 2301)   Physical Exam  Constitutional: Appears well-developed and well-nourished.  Psych: Affect appropriate to situation Eyes: No scleral injection HENT: No OP obstrucion Head: Normocephalic.  Cardiovascular: Normal rate and  irregular Respiratory: Effort normal and breath sounds normal to anterior ascultation GI: Soft.  No distension. There is no tenderness.  Skin: WDI  Neuro: Mental Status: Patient is awake, alert, oriented to person, place, month, year, and situation. Patient is able to give a clear and coherent history. No signs of aphasia or neglect Cranial Nerves: II: Visual Fields are full. Pupils are equal, round, and reactive to light.   III,IV, VI: EOMI without ptosis or diploplia.  V: Facial sensation is symmetric to temperature VII: Facial movement is notable for mild right droop VIII: hearing is intact to voice X: Uvula elevates  symmetrically XI: Shoulder shrug is symmetric. XII: tongue is midline without atrophy or fasciculations.  Motor: Tone is normal. Bulk is normal. 5/5 strength was present on the left side, 5 minus on the right Sensory: Sensation is symmetric to light touch and temperature in the arms and legs. Plantars: Toes are downgoing bilaterally.  Cerebellar: FNF with mild tremor bilaterally      I have reviewed labs in epic and the results pertinent to this consultation are: BMP-unremarkable  I have reviewed the images obtained: MRI brain-no acute infarct EEG-no epileptiform activity  Impression: 80 year old male with syncope. Given his documented low blood pressure and bradycardia, I feel that this is a much more likely etiology than seizure. I would favor pursuing workup from a cardiac standpoint.  Recommendations: 1) no further recommendations at this time. Neurology will sign off. Please call with any further questions or concerns.   Roland Rack, MD [facility] [phone]  If 7pm- 7am, please page neurology on call as listed in [place].

## 2014-06-22 NOTE — Progress Notes (Signed)
EEG completed, results pending. 

## 2014-06-22 NOTE — Consult Note (Signed)
CARDIOLOGY CONSULT NOTE   Patient ID: Nathaniel Lopez MRN: [id number], DOB/AGE: [birth year]   Admit date: 06/21/2014 Date of Consult: 06/22/2014   Primary Physician: [name], MD Primary Cardiologist: Dr. Sallyanne Kuster  Pt. Profile  80M with HFpEF, permanent AF off AC d/t bleed risk, COPD, DM2, CAD s/p 3v CABG (2006, LIMA to LAD, SVG to RCA, SVG to RI), prior stroke, dementia, who presents s/p syncopal event.   Problem List  Past Medical History  Diagnosis Date  . CHF (congestive heart failure)     diastolic dysfunction  . Atrial fibrillation     permanent  . Colon cancer   . Carotid arterial disease   . COPD (chronic obstructive pulmonary disease)     moderate 2006 by PFT FEV1 1.39  . Hyperlipidemia   . Type 2 diabetes mellitus   . Hypertension   . Hypothyroid   . Pancreatic cyst 12/14/2011    MRI - stable cyst lesion, likely benign  . Myocardial infarct   . Coronary artery disease   . Prostatitis   . Gallstones   . TIA (transient ischemic attack)   . Tremor   . Carotid bruit 12/17/2010    doppler - ICAs normal patency w/o evidence of significant diameter reduction/dissection; R ECA turbulent flowthroughout, possible source of bruit  . Cerebral atherosclerosis 11/10/2009    doppler - R bulb and proximal ICA 50-69% diameter reduction; R ECA high grade stenosis; L ICA 0-49% diameter reduction  . Carotid bruit 10/21/2006    doppler - R bulb and proximal ICA 50-69% diameter reduction, R ECA high grade stenosis; L ICA 0-49% diameter reduction  . Dementia   . Incomplete bladder emptying   . Acute pyelonephritis without lesion of renal medullary necrosis   . Full incontinence of feces   . Other specified disease of pancreas   . Unspecified disorder of prostate   . Herpes zoster without mention of complication   . Pain in joint, pelvic region and thigh   . Other vitamin B12 deficiency anemia   . Vascular dementia, uncomplicated   . Muscle weakness (generalized)   . Other  specified disorder of skin   . Depression   . Coronary atherosclerosis of unspecified type of vessel, native or graft     Past Surgical History  Procedure Laterality Date  . Hernia repair  2003  . Cholecystectomy  1990  . Coronary artery bypass graft  2006    LIMA to LAD; RSV to ICA; RSV to posterior descending CA  . Colon surgery  1989  . Cataract extraction Right 2006  . Tee with cardioversion  04/16/2010    EF 55-60%; LA mod dialted; AF precludes elval of LV diastolic fcn; septal motion consistent w/ post-thoracotomy state  . Cardiac catheterization  12/03/2004    total occulsion of LAD w/ retrograde filing, total occlusion RCA w/ l to r collaterals, 90% proximal optional diagonal disease; normal LV systolic fcn, no renal artery stenosis;   . Colonoscopy  01/27/2008    Dr.Magod, polyps  . Colonoscopy  03/02/2009    Dr.Magod, adenomatous and hyperplastic polyps no malignancy      Allergies  Allergies  Allergen Reactions  . Ace Inhibitors Cough  . Peanut-Containing Drug Products     REACTION: cough    HPI   80M with HFpEF, permanent AF off AC d/t bleed risk, COPD, DM2, CAD s/p 3v CABG (2006, LIMA to LAD, SVG to RCA, SVG to RI), prior stroke, dementia, who presents s/p syncopal event.  [name] was in his USOH today while eating with his children when he syncopized. There was apparently no prodromal symptoms or warning of any kind. While feeding himself he became abruptly "not responsive" and lost some tone. After a short period he awoke somewhat but was not responding to stimuli.  His kids state he was grey and clammy and "a little slumped" but did not lose all tone. EMS was called. At one point he appeared rigid without any tremors, shaking, or tonic clonic type activity. No bower or bladder incontinence. He was reportedly unconscious for a total of 15 minutes, but did have some intervening periods with brief improvements in mental status. No CPR was given at any time. On EMS  arrival, his HR was 30 (rhythm unclear, no strips available for review) and SBP 50; pt given 500cc NS bolus with some improvement in BP.   On arrival to the ED, P63, BP 78/44. 97% on RA. He was given 1L of NS with further improvement in BP. CXR unremarkable. Labs notable for K 3.5, Cr 1.24, BUN 20, POC TnI 0.01, UA negative, BNP 124. On my exam, I am told he is back to baseline.     He has apparently had minor episodes of becoming grey and clammy in the past. They often occur while eating and have apparently been attributed to bradycardia. At baseline, he is dependent on his ADLS but is able ambulate on his own.   Inpatient Medications    Family History Family History  Problem Relation Age of Onset  . Heart attack Mother   . Breast cancer Mother   . Heart disease Mother 29    myocardiomyopathy  . Alzheimer's disease Sister      Social History History   Social History  . Marital Status: Widowed    Spouse Name: N/A    Number of Children: N/A  . Years of Education: N/A   Occupational History  . 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 Smoking status: Former Smoker -- 2.00 packs/day for 20 years    Types: Cigarettes    Quit date: 07/01/1968  . Smokeless tobacco: Never Used  . Alcohol Use: 0.6 oz/week    1 Cans of beer per week     Comment: social  . Drug Use: No  . Sexual Activity: Not on file   Other Topics Concern  . Not on file   Social History Narrative   Pt is Jehovah's Witness   MMSE completed 07/29/2011 (25)   Last Annual Exam 07/29/2011 ([facility])     Review of Systems  Unable to obtain  Physical Exam  Blood pressure 111/55, pulse 72, temperature 97.9 F (36.6 C), temperature source Oral, resp. rate 16, height 5\' 9"  (1.753 m), weight 80.604 kg (177 lb 11.2 oz), SpO2 99 %.  General: Pleasant, NAD Psych: Normal affect. Neuro: Alert . Moves all extremities spontaneously. HEENT: Normal  Neck: Supple without bruits or JVD. Lungs:   Resp regular and unlabored, CTA. Heart: RRR no s3, s4 Abdomen: Soft, non-tender, non-distended, BS + x 4.  Extremities: No clubbing, cyanosis or edema. DP/PT/Radials 2+ and equal bilaterally.  Labs  No results for input(s): CKTOTAL, CKMB, TROPONINI in the last 72 hours. Lab Results  Component Value Date   WBC 6.7 06/21/2014   HGB 13.1 06/21/2014   HCT 38.6* 06/21/2014   MCV 93.0 06/21/2014   PLT 238 06/21/2014    Recent Labs Lab 06/21/14 1950  NA 139  K 3.5  CL 107  CO2 21  BUN 20  CREATININE 1.24  CALCIUM 8.7  GLUCOSE 144*   Lab Results  Component Value Date   HDL 37* 01/25/2014   LDLCALC 65 01/25/2014   TRIG 169* 01/25/2014   No results found for: DDIMER  Radiology/Studies  Dg Chest Port 1 View  06/21/2014   CLINICAL DATA:  Syncope, lower back pain  EXAM: PORTABLE CHEST - 1 VIEW  COMPARISON:  07/22/2012  FINDINGS: Cardiomediastinal silhouette is stable. Again noted status post CABG. No segmental infiltrate or pulmonary edema. Minimal interstitial prominence bilaterally.  IMPRESSION: No segmental infiltrate or pulmonary edema.  Status post CABG.   Electronically Signed   By: Lahoma Crocker M.D.   On: 06/21/2014 20:10    ECG  06/22/14: rate controlled AF @ 73bpm. Compared to 03/10/14, PVC is no longer present.  ASSESSMENT AND PLAN  33M with HFpEF, permanent AF off AC d/t bleed risk, COPD, DM2, CAD s/p 3v CABG (2006, LIMA to LAD, SVG to RCA, SVG to RI), prior stroke, dementia, who presents s/p syncopal event.   While it is certainly possible that the syncopal event was in fact due to bradycardia, it is notable that on arrival to the ED, hypotension persisted despite normalization of the HR.  Thus, there is some other mechanism in the mix, perhaps hypovolemia or increased vagal tone. Although it is possible that the bradycardia AND hypotension are both due to a vagal response, there was no clear event that would have precipitated such a profound vagal episode. I am told  that his HRs at home are usually in the 47M-54Y so it is certainly possible to decrease the nodal agents in hopes of reducing the likelihood of this event in the future; will need to hold CCB completely for now. Notably, there does not appear to be a potential infectious cause - UA and CXR are normal. Other possibilities include primary neurologic causes - e.g. seizures, CVA.    1. Hold diltiazem, flomax, lasix, diltiazem, cozaar 2. Check TSH with history of hypothroidism 3. Check TTE to r/o structural abnormality 4. Telemetry monitoring 5. Hopefully with reduction in CCB, a PPM can be avoided   Signed, Lamar Sprinkles, MD 06/22/2014, 12:57 AM

## 2014-06-22 NOTE — Care Management Note (Signed)
    Page 1 of 1   06/29/2014     10:26:20 AM CARE MANAGEMENT NOTE 06/29/2014  Patient:  Nathaniel Lopez,Nathaniel Lopez   Account Number:  [id number]  Date Initiated:  06/22/2014  Documentation initiated by:  AMERSON,JULIE  Subjective/Objective Assessment:   Pt adm on 06/21/14 with bradycardia, hypotension, and unresponsiveness.  PTA, pt resides at home with children.     Action/Plan:   PT/OT consults pending.  MD notes state pt may need rehab...will follow for recommendations.  CSW updated for possible need for SNF.   Anticipated DC Date:  06/25/2014   Anticipated DC Plan:  SKILLED NURSING FACILITY  In-house referral  Clinical Social Worker      DC Planning Services  CM consult      Choice offered to / List presented to:             Status of service:  Completed, signed off Medicare Important Message given?   (If response is "NO", the following Medicare IM given date fields will be blank) Date Medicare IM given:   Medicare IM given by:   Date Additional Medicare IM given:   Additional Medicare IM given by:    Discharge Disposition:  HOME/SELF CARE  Per UR Regulation:  Reviewed for med. necessity/level of care/duration of stay  If discussed at Long Length of Stay Meetings, dates discussed:    Comments:  06-29-14 1025 Jacqlyn Krauss, RN,BSN [phone] CM did call pt's son in ref to [facility] services. UNable to speak to pt, however son stated pt will not need services at this time.

## 2014-06-23 ENCOUNTER — Inpatient Hospital Stay (HOSPITAL_COMMUNITY): Payer: Medicare Other

## 2014-06-23 DIAGNOSIS — I369 Nonrheumatic tricuspid valve disorder, unspecified: Secondary | ICD-10-CM

## 2014-06-23 LAB — BRAIN NATRIURETIC PEPTIDE: B Natriuretic Peptide: 274.9 pg/mL — ABNORMAL HIGH (ref 0.0–100.0)

## 2014-06-23 LAB — GLUCOSE, CAPILLARY
GLUCOSE-CAPILLARY: 142 mg/dL — AB (ref 70–99)
Glucose-Capillary: 159 mg/dL — ABNORMAL HIGH (ref 70–99)
Glucose-Capillary: 232 mg/dL — ABNORMAL HIGH (ref 70–99)
Glucose-Capillary: 155 mg/dL — ABNORMAL HIGH (ref 70–99)

## 2014-06-23 MED ORDER — ACETAMINOPHEN 325 MG PO TABS: 650.0000 mg | ORAL_TABLET | Freq: Four times a day (QID) | ORAL | Status: AC | PRN

## 2014-06-23 MED ORDER — AMLODIPINE BESYLATE 10 MG PO TABS
10.0000 mg | ORAL_TABLET | Freq: Every day | ORAL | Status: DC
  Administered 2014-06-23: 10 mg via ORAL
  Filled 2014-06-23 (×2): qty 1

## 2014-06-23 MED ORDER — FUROSEMIDE 10 MG/ML IJ SOLN
20.0000 mg | Freq: Once | INTRAMUSCULAR | Status: AC
  Administered 2014-06-23: 20 mg via INTRAVENOUS
  Filled 2014-06-23: qty 2

## 2014-06-23 NOTE — Progress Notes (Signed)
Subjective:  Awake and alert this am. He had dyspnea last night- better after steroids.   Objective:  Vital Signs in the last 24 hours: Temp:  [97.6 F (36.4 C)-98.7 F (37.1 C)] 97.6 F (36.4 C) (12/24 0447) Pulse Rate:  [70-81] 70 (12/24 0447) Resp:  [18-20] 18 (12/24 0447) BP: (135-156)/(63-93) 155/84 mmHg (12/24 0803) SpO2:  [97 %-98 %] 98 % (12/24 0447) Weight:  [187 lb 1.6 oz (84.868 kg)] 187 lb 1.6 oz (84.868 kg) (12/24 0447)  Intake/Output from previous day:  Intake/Output Summary (Last 24 hours) at 06/23/14 0945 Last data filed at 06/23/14 0505  Gross per 24 hour  Intake 1774.25 ml  Output   1951 ml  Net -176.75 ml    Physical Exam: General appearance: alert, cooperative and no distress Neck: no carotid bruit and no JVD Lungs: clear to auscultation bilaterally Heart: irregularly irregular rhythm   Rate: 70-110  Rhythm: atrial fibrillation  Lab Results:  Recent Labs  06/21/14 1950 06/22/14 0500  WBC 6.7 6.6  HGB 13.1 11.9*  PLT 238 210    Recent Labs  06/21/14 1950 06/22/14 0500  NA 139 140  K 3.5 3.7  CL 107 109  CO2 21 27  GLUCOSE 144* 126*  BUN 20 19  CREATININE 1.24 1.29   No results for input(s): TROPONINI in the last 72 hours.  Invalid input(s): CK, MB No results for input(s): INR in the last 72 hours.  Imaging: Imaging results have been reviewed   Assessment/Plan:  77M with, permanent AF off AC d/t bleed risk, COPD, DM2, CAD s/p 3v CABG (2006, LIMA to LAD, SVG to RCA, SVG to RI), normal LVF by echo 2014,prior stroke, and dementia. He was at a restaurant with his family when he had a syncopal episode. EMS reported a HR of 30 and low B/P. There are no strips available. He was admitted and taken off chronotropic agents (Diltiazem 240 mg), and antihypertensives (Cozaar, Lasix).    Principal Problem:   Syncope and collapse Active Problems:   Bradycardia-reported (HR of 30 but no strips)   Arterial hypotension   Chronic atrial  fibrillation   Chronic diastolic heart failure   CAD (coronary artery disease) s/p CABG 2006   DM type 2 with diabetic peripheral neuropathy   CKD (chronic kidney disease) stage 3, GFR 30-59 ml/min   COPE (chronic obstructive pulmonary emphysema)   Alzheimer's dementia with behavioral disturbance   Dysphagia   PLAN: I spoke with the pt's daughter and son in law. The pt has dementia. He lives with them part of the week but then stays by himself a few days a week as well. Pacemaker has been mentioned in the past. The family thought this might be too aggressive.  We will ambulate today and monitor his HR, it is starting to drift up. Will check BNP and add prn inhaler after last nights episode of dyspnea.   Kerin Ransom PA-C Beeper [phone] 06/23/2014, 9:45 AM   Patient seen and examined. I agree with the assessment and plan as detailed above. See also my additional thoughts below.   I have reviewed the data very carefully and spoken at length with Mr. Rosalyn Gess. I also spoke with the patient's daughter and son-in-law. The patient's daughter had worked as a [employer] manager for many years in the past. She mentions that she does not want him to have any recurrent spells similar to the spell in the restaurant. However, she knows that his overall mental status  is limited. She thinks that he would not be able to wear a monitor if this were recommended to help with any further assessment. Historically the patient has had spells that she describes with decrease blood pressure. We know that at the restaurant the heart rate was slow at 30. It appears that he probably has significant bradycardia/tachycardia syndrome. However the approach to therapy is complicated because of his mental status. I feel it is most prudent to stop any medications that could slow his heart rate. I would then proceed with ambulation. If he has increased heart rate with ambulation, but has no significant symptoms, it may be best to follow  him with this scenario.   Daryel November, MD

## 2014-06-23 NOTE — Progress Notes (Signed)
Patient Demographics  Nathaniel Lopez, is a 80 y.o. male, DOB - [birth year], BSJ:[id number]  Admit date - 06/21/2014   Admitting Physician Waldemar Dickens, MD  Outpatient Primary MD for the patient is [name], MD  LOS - 2   Chief Complaint  Patient presents with  . Near Syncope  . Hypotension        Subjective:   Nathaniel Lopez today has, No headache, No chest pain, No abdominal pain - No Nausea, No new weakness tingling or numbness, No Cough - SOB.    Assessment & Plan    1. Syncope and Collapse - ? Due to bradycardia and hypotension per EMS, offending medications held, blood pressure stable, symptom-free, no focal deficits. However these episodes have happened intermittently in the past few times also per family. CT head unremarkable, stable TSH, EEG MRI brain stable, seen by neuro. Pending echogram, Cardiology following.   2. History of atrial fibrillation. Currently rate controlled, diltiazem held due to possible bradycardia at home with hypotension, as needed IV Lopressor, poor candidate for anticoagulation due to high fall risk and risk of bleeding. Monitor on telemetry. Table TSH.   3. Dysphagia. Speech following. Currently on dysphagia 3 diet.   4. Dementia. Currently on Aricept, at risk for delirium, continue Remeron and Seroquel.   5. BPH. Continue Flomax and monitor. Monitor blood pressures.   6. DM type II with diabetic neuropathy. Currently on sliding scale, continue Neurontin, monitor CBGs and even see.  Lab Results  Component Value Date   HGBA1C 6.2* 06/21/2014    CBG (last 3)   Recent Labs  06/22/14 2046 06/23/14 0722 06/23/14 1122  GLUCAP 130* 159* 232*    7. Acute on chronic diastolic CHF and recent EF 50% on echo 2014. Brief episode of shortness of  breath morning of 06/23/2014. Chest x-ray likely asymmetric edema, responded well to IV Lasix, Monitor. Echo pending.     Code Status: DNR  Family Communication: Son and daughter in law  Disposition Plan: TBD    Procedures  Echogram, MRI brain, CT head  TTE    EEG  This is a normal EEG recording during wakefulness and during sleep. No evidence of an epileptic disorder was demonstrated.   Consults  Neuro, cards   Medications  Scheduled Meds: . amLODipine  10 mg Oral Daily  . aspirin  325 mg Oral Daily  . donepezil  10 mg Oral QHS  . gabapentin  100 mg Oral BID  . heparin  5,000 Units Subcutaneous 3 times per day  . insulin aspart  0-9 Units Subcutaneous TID WC  . mirtazapine  30 mg Oral QHS  . QUEtiapine  25 mg Oral Daily  . senna  1 tablet Oral BID  . sodium chloride  3 mL Intravenous Q12H  . tamsulosin  0.8 mg Oral Daily   Continuous Infusions:   PRN Meds:.acetaminophen **OR** [DISCONTINUED] acetaminophen, levalbuterol, [DISCONTINUED] ondansetron **OR** ondansetron (ZOFRAN) IV  DVT Prophylaxis     Lab Results  Component Value Date   PLT 210 06/22/2014    Antibiotics     Anti-infectives    None          Objective:   Filed Vitals:   06/22/14 2023 06/22/14 2024 06/23/14  0447 06/23/14 0803  BP:  144/87 156/93 155/84  Pulse:  81 70   Temp:  98.7 F (37.1 C) 97.6 F (36.4 C)   TempSrc:  Oral Oral   Resp:  20 18   Height:      Weight:   84.868 kg (187 lb 1.6 oz)   SpO2: 97% 97% 98%     Wt Readings from Last 3 Encounters:  06/23/14 84.868 kg (187 lb 1.6 oz)  06/21/14 79.379 kg (175 lb)  03/10/14 82.555 kg (182 lb)     Intake/Output Summary (Last 24 hours) at 06/23/14 1225 Last data filed at 06/23/14 1051  Gross per 24 hour  Intake   1996 ml  Output   3051 ml  Net  -1055 ml     Physical Exam  Awake Alert, Oriented X 3, No new F.N deficits, Normal affect [place].AT,PERRAL Supple Neck,No JVD, No cervical lymphadenopathy appriciated.   Symmetrical Chest wall movement, Good air movement bilaterally, CTAB RRR,No Gallops,Rubs or new Murmurs, No Parasternal Heave +ve B.Sounds, Abd Soft, No tenderness, No organomegaly appriciated, No rebound - guarding or rigidity. No Cyanosis, Clubbing or edema, No new Rash or bruise      Data Review   Micro Results No results found for this or any previous visit (from the past 240 hour(s)).  Radiology Reports Mr Brain Wo Contrast  06/22/2014   CLINICAL DATA:  Syncope and collapse.  EXAM: MRI HEAD WITHOUT CONTRAST  TECHNIQUE: Multiplanar, multiecho pulse sequences of the brain and surrounding structures were obtained without intravenous contrast.  COMPARISON:  Head CT 05/12/2010 and MRI 09/11/2009  FINDINGS: Dedicated thin section imaging through the temporal lobes demonstrates symmetric volume and signal of the hippocampi. There is no evidence of heterotopia.  There is no evidence of acute infarct, intracranial hemorrhage, mass, midline shift, or extra-axial fluid collection. There is moderate cerebral atrophy. Small, chronic infarcts are again seen in the basal ganglia and periatrial white matter bilaterally. Patchy T2 hyperintensities elsewhere in the cerebral white matter do not appear significantly changed and are nonspecific but compatible with mild to moderate chronic small vessel ischemic disease.  Prior bilateral cataract extraction is noted. There is mild left frontal sinus and left ethmoid air cell mucosal thickening. Trace left mastoid fluid is noted. Major intracranial vascular flow voids are preserved.  IMPRESSION: 1. No evidence of acute intracranial abnormality or mass. 2. Chronic infarcts and chronic small vessel ischemic disease without significant interval change.   Electronically Signed   By: Logan Bores   On: 06/22/2014 15:38   Dg Chest Port 1 View  06/23/2014   CLINICAL DATA:  Difficulty breathing  EXAM: PORTABLE CHEST - 1 VIEW  COMPARISON:  June 21, 2014  FINDINGS:  There is new patchy airspace consolidation in the left base. Lungs elsewhere clear. Heart is mildly enlarged with pulmonary vascularity within normal limits. Patient is status post coronary artery bypass grafting. No adenopathy.  IMPRESSION: Patchy airspace consolidation left base.   Electronically Signed   By: Lowella Grip M.D.   On: 06/23/2014 08:20   Dg Chest Port 1 View  06/21/2014   CLINICAL DATA:  Syncope, lower back pain  EXAM: PORTABLE CHEST - 1 VIEW  COMPARISON:  07/22/2012  FINDINGS: Cardiomediastinal silhouette is stable. Again noted status post CABG. No segmental infiltrate or pulmonary edema. Minimal interstitial prominence bilaterally.  IMPRESSION: No segmental infiltrate or pulmonary edema.  Status post CABG.   Electronically Signed   By: Lahoma Crocker M.D.   On:  06/21/2014 20:10     CBC  Recent Labs Lab 06/21/14 1950 06/22/14 0500  WBC 6.7 6.6  HGB 13.1 11.9*  HCT 38.6* 35.8*  PLT 238 210  MCV 93.0 91.6  MCH 31.6 30.4  MCHC 33.9 33.2  RDW 13.8 13.9  LYMPHSABS 2.4  --   MONOABS 0.5  --   EOSABS 0.1  --   BASOSABS 0.0  --     Chemistries   Recent Labs Lab 06/21/14 1950 06/22/14 0500  NA 139 140  K 3.5 3.7  CL 107 109  CO2 21 27  GLUCOSE 144* 126*  BUN 20 19  CREATININE 1.24 1.29  CALCIUM 8.7 8.5   ------------------------------------------------------------------------------------------------------------------ estimated creatinine clearance is 46.1 mL/min (by C-G formula based on Cr of 1.29). ------------------------------------------------------------------------------------------------------------------  Recent Labs  06/21/14 1716  HGBA1C 6.2*   ------------------------------------------------------------------------------------------------------------------ No results for input(s): CHOL, HDL, LDLCALC, TRIG, CHOLHDL, LDLDIRECT in the last 72  hours. ------------------------------------------------------------------------------------------------------------------  Recent Labs  06/22/14 1054  TSH 0.873   ------------------------------------------------------------------------------------------------------------------ No results for input(s): VITAMINB12, FOLATE, FERRITIN, TIBC, IRON, RETICCTPCT in the last 72 hours.  Coagulation profile No results for input(s): INR, PROTIME in the last 168 hours.  No results for input(s): DDIMER in the last 72 hours.  Cardiac Enzymes No results for input(s): CKMB, TROPONINI, MYOGLOBIN in the last 168 hours.  Invalid input(s): CK ------------------------------------------------------------------------------------------------------------------ Invalid input(s): POCBNP     Time Spent in minutes 35   SINGH,PRASHANT K M.D on 06/23/2014 at 12:25 PM  Between 7am to 7pm - Pager - [phone]  After 7pm go to [email or web address] - [facility] Office  [phone]

## 2014-06-23 NOTE — Progress Notes (Signed)
  Echocardiogram 2D Echocardiogram has been performed.  Nathaniel Lopez 06/23/2014, 4:55 PM

## 2014-06-23 NOTE — Progress Notes (Signed)
Kerin Ransom, [place] notified of HR 120-140s when patient ambulated in hall. Patient asymptomatic. HR < 100 when patient returned to chair.

## 2014-06-23 NOTE — Progress Notes (Signed)
Speech Language Pathology Treatment: Dysphagia  Patient Details Name: Nathaniel Lopez MRN: [id number] DOB: 1928-06-02 Today's Date: 06/23/2014 Time: 9021-1155 SLP Time Calculation (min) (ACUTE ONLY): 8 min  Assessment / Plan / Recommendation Clinical Impression  F/u after yesterday's swallow evaluation.  Pt with sufficient mastication of regular consistencies, no overt s/s of aspiration.  Recommend allowing a regular diet with thin liquids.  D/W pt, family.  No SLP f/u warranted - will sign off.    HPI HPI: Pt is an 80 yo male who was eating dinner when he slumped over and became unresponsive on 06/21/14. Per family, pt did not fall and there was no head trauma. Pt slowly came around w/ stimulation by family. It took 15 min for pt to return to baseline. Pt's family report that pt was rigid at this time w/ a fixed gaze. Family reported that this happened 4 times during this 15 min period. Episodes would last a couple minutes and would then be groggy and out of it after each episode. EMS called and pt was noted to have a HR in th3 40s and SBP of 60. Pt given 1L NS by EMS w/ improvement in VS. Initial troponin and EKG nml; Pt has hx of dysphagia per family, but last MBS completed in 2009 indicated a typical swallow for age; family reports coughing with meals due to taking large bites and being impulsive during meals.   Pertinent Vitals Pain Assessment: No/denies pain  SLP Plan  All goals met    Recommendations Diet recommendations: Regular;Thin liquid Medication Administration: Whole meds with liquid Supervision: Patient able to self feed Postural Changes and/or Swallow Maneuvers: Seated upright 90 degrees              Plan: All goals met   Nathaniel Lopez L. Tivis Ringer, [place] CCC/SLP Pager [phone]      Nathaniel Lopez 06/23/2014, 11:54 AM

## 2014-06-23 NOTE — Progress Notes (Signed)
Shift event: Earlier in shift, RN paged this NP secondary to pt having wheezing, both audible and auscultated. Increased RR but no increased WOB. O2 sat 96% on RA. On admission, CXR negative for acute issues. Has hx CHF but no edema on CXR. No crackles on exam. He was taken off some BP meds and Lasix upon admission secondary to syncope episode. Neb treatment and Solumedrol given stat and pt improved greatly. Ordered q6hr prn Xopenex neb. Clance Boll, NP [facility]

## 2014-06-23 NOTE — Evaluation (Signed)
Physical Therapy Evaluation Patient Details Name: Angela Platner MRN: [id number] DOB: 12-Nov-1927 Today's Date: 06/23/2014   History of Present Illness  HPI: Pt is an 80 yo male who was eating dinner when he slumped over and became unresponsive on 06/21/14. Per family, pt did not fall and there was no head trauma. Pt slowly came around w/ stimulation by family. It took 15 min for pt to return to baseline. Pt's family report that pt was rigid at this time w/ a fixed gaze. Family reported that this happened 4 times during this 15 min period. Episodes would last a couple minutes and would then be groggy and out of it after each episode. EMS called and pt was noted to have a HR in th3 40s and SBP of 60.   Clinical Impression  Patient overall did well with mobility.  Per family, patient has "good" days and "bad" days which dictate where he stays (home or with son) and how much help he needs.  Overall today he required min guard assist for gait with RW.  Nursing reports his HR did increase "pretty high" during gait.  Patient did not show any outward signs of distress or SOB.  Feel patient is safe to d/c home with family from PT standpoint, they appear to have a good sense of his level of care.  Will sign off.    Follow Up Recommendations Home health PT    Equipment Recommendations  None recommended by PT    Recommendations for Other Services       Precautions / Restrictions Precautions Precautions: Fall Restrictions Weight Bearing Restrictions: No      Mobility  Bed Mobility               General bed mobility comments: in recliner at beginning of session  Transfers Overall transfer level: Needs assistance Equipment used: Rolling walker (2 wheeled) Transfers: Sit to/from Stand Sit to Stand: Supervision         General transfer comment: used armrests sit - to - stand  Ambulation/Gait Ambulation/Gait assistance: Min assist Ambulation Distance (Feet): 150 Feet Assistive device:  1 person hand held assist Gait Pattern/deviations: Step-through pattern;Staggering left;Staggering right Gait velocity: decreased   General Gait Details: patient fairly unsteady when ambulating with 1 person hand held assist.  Attempted gait with RW as well - able to ambulate with min-guard assistance x 150  Stairs            Wheelchair Mobility    Modified Rankin (Stroke Patients Only)       Balance Overall balance assessment: Needs assistance Sitting-balance support: No upper extremity supported;Feet supported Sitting balance-Leahy Scale: Good     Standing balance support: Single extremity supported Standing balance-Leahy Scale: Poor                               Pertinent Vitals/Pain Pain Assessment: No/denies pain    Home Living Family/patient expects to be discharged to:: Private residence Living Arrangements: Children Available Help at Discharge: Family Type of Home: House       Home Layout: One level Home Equipment: Environmental consultant - 2 wheels;Cane - single point      Prior Function Level of Independence: Independent with assistive device(s)         Comments: indepedent at times - stays at his house, requires assistance at times - stays with son and daughter in law.       Hand Dominance  Extremity/Trunk Assessment   Upper Extremity Assessment: Overall WFL for tasks assessed           Lower Extremity Assessment: Overall WFL for tasks assessed      Cervical / Trunk Assessment: Normal  Communication   Communication: No difficulties  Cognition Arousal/Alertness: Awake/alert Behavior During Therapy: WFL for tasks assessed/performed Overall Cognitive Status: History of cognitive impairments - at baseline                      General Comments      Exercises        Assessment/Plan    PT Assessment All further PT needs can be met in the next venue of care  PT Diagnosis Difficulty walking   PT Problem List  Decreased activity tolerance;Decreased balance;Decreased mobility;Cardiopulmonary status limiting activity  PT Treatment Interventions     PT Goals (Current goals can be found in the Care Plan section) Acute Rehab PT Goals Patient Stated Goal: family:  get him back home PT Goal Formulation: All assessment and education complete, DC therapy    Frequency     Barriers to discharge        Co-evaluation               End of Session Equipment Utilized During Treatment: Gait belt Activity Tolerance: Patient tolerated treatment well Patient left: in chair;with family/visitor present;with call bell/phone within reach           Time: 1350-1404 PT Time Calculation (min) (ACUTE ONLY): 14 min   Charges:   PT Evaluation $Initial PT Evaluation Tier I: 1 Procedure     PT G CodesShanna Cisco 06/23/2014, 2:10 PM  06/23/2014 [place], [place]

## 2014-06-24 LAB — GLUCOSE, CAPILLARY
GLUCOSE-CAPILLARY: 128 mg/dL — AB (ref 70–99)
GLUCOSE-CAPILLARY: 129 mg/dL — AB (ref 70–99)
GLUCOSE-CAPILLARY: 172 mg/dL — AB (ref 70–99)
Glucose-Capillary: 105 mg/dL — ABNORMAL HIGH (ref 70–99)

## 2014-06-24 MED ORDER — PINDOLOL 5 MG PO TABS
2.5000 mg | ORAL_TABLET | Freq: Two times a day (BID) | ORAL | Status: DC
  Administered 2014-06-24 – 2014-06-25 (×2): 2.5 mg via ORAL
  Filled 2014-06-24 (×4): qty 1

## 2014-06-24 MED ORDER — POTASSIUM CHLORIDE CRYS ER 20 MEQ PO TBCR
40.0000 meq | EXTENDED_RELEASE_TABLET | Freq: Once | ORAL | Status: AC
  Administered 2014-06-24: 40 meq via ORAL
  Filled 2014-06-24: qty 2

## 2014-06-24 MED ORDER — ASPIRIN 81 MG PO CHEW
81.0000 mg | CHEWABLE_TABLET | Freq: Every day | ORAL | Status: DC
  Administered 2014-06-24 – 2014-06-25 (×2): 81 mg via ORAL
  Filled 2014-06-24 (×2): qty 1

## 2014-06-24 MED ORDER — FUROSEMIDE 10 MG/ML IJ SOLN
40.0000 mg | Freq: Once | INTRAMUSCULAR | Status: AC
  Administered 2014-06-24: 40 mg via INTRAVENOUS
  Filled 2014-06-24: qty 4

## 2014-06-24 MED ORDER — SODIUM CHLORIDE 0.9 % IV BOLUS (SEPSIS)
500.0000 mL | Freq: Once | INTRAVENOUS | Status: AC
  Administered 2014-06-24: 500 mL via INTRAVENOUS

## 2014-06-24 NOTE — Progress Notes (Addendum)
Consulted S. Osman - Holding PM dose of beta blocker - manual BP 100/58 - pt asymptomatic , will recheck pt through night and give BB if BP begins to rise.

## 2014-06-24 NOTE — Progress Notes (Signed)
Patient Name: Nathaniel Lopez      SUBJECTIVE:  49  [place] admitted with recurrent syncope   Noted on EMS arrival to be bradycardia and hypotensive and persistently hypotensive in the ER with normal HR  Apparently with decreased responsiveness for about 10 min  Hx review but difficult to get from patient  AV nodal blocking agents held and with ambulation yday HR>>130s  HFpEF, permanent AF off AC d/t bleed risk, COPD, DM2, CAD s/p 3v CABG (2006, LIMA to LAD, SVG to RCA, SVG to RI), prior stroke, dementia,   Echo 12/15>>EF 60%  Past Medical History  Diagnosis Date  . CHF (congestive heart failure)     diastolic dysfunction  . Atrial fibrillation     permanent  . Colon cancer   . Carotid arterial disease   . COPD (chronic obstructive pulmonary disease)     moderate 2006 by PFT FEV1 1.39  . Hyperlipidemia   . Type 2 diabetes mellitus   . Hypertension   . Hypothyroid   . Pancreatic cyst 12/14/2011    MRI - stable cyst lesion, likely benign  . Myocardial infarct   . Coronary artery disease   . Prostatitis   . Gallstones   . TIA (transient ischemic attack)   . Tremor   . Carotid bruit 12/17/2010    doppler - ICAs normal patency w/o evidence of significant diameter reduction/dissection; R ECA turbulent flowthroughout, possible source of bruit  . Cerebral atherosclerosis 11/10/2009    doppler - R bulb and proximal ICA 50-69% diameter reduction; R ECA high grade stenosis; L ICA 0-49% diameter reduction  . Carotid bruit 10/21/2006    doppler - R bulb and proximal ICA 50-69% diameter reduction, R ECA high grade stenosis; L ICA 0-49% diameter reduction  . Dementia   . Incomplete bladder emptying   . Acute pyelonephritis without lesion of renal medullary necrosis   . Full incontinence of feces   . Other specified disease of pancreas   . Unspecified disorder of prostate   . Herpes zoster without mention of complication   . Pain in joint, pelvic region and thigh   . Other vitamin  B12 deficiency anemia   . Vascular dementia, uncomplicated   . Muscle weakness (generalized)   . Other specified disorder of skin   . Depression   . Coronary atherosclerosis of unspecified type of vessel, native or graft     Scheduled Meds:  Scheduled Meds: . amLODipine  10 mg Oral Daily  . aspirin  325 mg Oral Daily  . donepezil  10 mg Oral QHS  . gabapentin  100 mg Oral BID  . heparin  5,000 Units Subcutaneous 3 times per day  . insulin aspart  0-9 Units Subcutaneous TID WC  . mirtazapine  30 mg Oral QHS  . QUEtiapine  25 mg Oral Daily  . senna  1 tablet Oral BID  . sodium chloride  3 mL Intravenous Q12H  . tamsulosin  0.8 mg Oral Daily   Continuous Infusions:  acetaminophen **OR** [DISCONTINUED] acetaminophen, levalbuterol, [DISCONTINUED] ondansetron **OR** ondansetron (ZOFRAN) IV    PHYSICAL EXAM Filed Vitals:   06/23/14 1417 06/23/14 1751 06/23/14 2059 06/24/14 0446  BP: 115/57  121/50 146/85  Pulse: 80  71 73  Temp: 99.1 F (37.3 C)  97.8 F (36.6 C) 98.4 F (36.9 C)  TempSrc: Oral  Oral Oral  Resp: 19  18 18   Height:      Weight:  184 lb 11.2 oz (83.779 kg)  SpO2: 96% 96% 98% 95%    Well developed and nourished in no acute distress HENT normal Neck supple   Clear Irregular rate and rhythm, no murmurs or gallops Abd-soft with active BS No Clubbing cyanosis tr edema Skin-warm and dry A & Oriented  Grossly normal sensory and motor function   TELEMETRY: Reviewed telemetry pt in  afib HR 80-140   Intake/Output Summary (Last 24 hours) at 06/24/14 0717 Last data filed at 06/24/14 0456  Gross per 24 hour  Intake 701.75 ml  Output   2500 ml  Net -1798.25 ml    LABS: Basic Metabolic Panel:  Recent Labs Lab 06/21/14 1950 06/22/14 0500  NA 139 140  K 3.5 3.7  CL 107 109  CO2 21 27  GLUCOSE 144* 126*  BUN 20 19  CREATININE 1.24 1.29  CALCIUM 8.7 8.5   Cardiac Enzymes: No results for input(s): CKTOTAL, CKMB, CKMBINDEX, TROPONINI in the  last 72 hours. CBC:  Recent Labs Lab 06/21/14 1950 06/22/14 0500  WBC 6.7 6.6  NEUTROABS 3.8  --   HGB 13.1 11.9*  HCT 38.6* 35.8*  MCV 93.0 91.6  PLT 238 210   PROTIME: No results for input(s): LABPROT, INR in the last 72 hours. Liver Function Tests: No results for input(s): AST, ALT, ALKPHOS, BILITOT, PROT, ALBUMIN in the last 72 hours. No results for input(s): LIPASE, AMYLASE in the last 72 hours. BNP: BNP (last 3 results) No results for input(s): PROBNP in the last 8760 hours. D-Dimer: No results for input(s): DDIMER in the last 72 hours. Hemoglobin A1C:  Recent Labs  06/21/14 1716  HGBA1C 6.2*   Fasting Lipid Panel: No results for input(s): CHOL, HDL, LDLCALC, TRIG, CHOLHDL, LDLDIRECT in the last 72 hours. Thyroid Function Tests:  Recent Labs  06/22/14 1054  TSH 0.873   Anemia Panel: No results for input(s): VITAMINB12, FOLATE, FERRITIN, TIBC, IRON, RETICCTPCT in the last 72 hours.     ASSESSMENT AND PLAN:  Principal Problem:   Syncope and collapse Active Problems:   Chronic atrial fibrillation   Chronic diastolic heart failure   COPE (chronic obstructive pulmonary emphysema)   CAD (coronary artery disease) s/p CABG 2006   DM type 2 with diabetic peripheral neuropathy   Bradycardia-reported (HR of 30 but no strips)   Alzheimer's dementia with behavioral disturbance   Dysphagia   CKD (chronic kidney disease) stage 3, GFR 30-59 ml/min   Arterial hypotension  1) decrease ASA to 81  2) try low dose ISA BB pindolol 2.5 bid 3) amlodipine to 5  4) consider implantable loop recorder would review wi family  Not  Here this early christmas morning  Signed, Virl Axe MD  06/24/2014

## 2014-06-24 NOTE — Progress Notes (Signed)
HR max 100 when ambulated 300 feet in hallway today. No signs/symptoms of distress.

## 2014-06-24 NOTE — Progress Notes (Signed)
Patient Demographics  Nathaniel Lopez, is a 80 y.o. male, DOB - [birth year], ZOX:[id number]  Admit date - 06/21/2014   Admitting Physician Waldemar Dickens, MD  Outpatient Primary MD for the patient is [name], MD  LOS - 3   Chief Complaint  Patient presents with  . Near Syncope  . Hypotension        Subjective:   Nathaniel Lopez today has, No headache, No chest pain, No abdominal pain - No Nausea, No new weakness tingling or numbness, No Cough - SOB.    Assessment & Plan    1. Syncope and Collapse - ? Due to bradycardia and hypotension per EMS, offending medications held, blood pressure stable, symptom-free, no focal deficits. However these episodes have happened intermittently in the past few times also per family. CT head unremarkable, stable TSH, EEG MRI brain stable, seen by neuro. echogram stable, Cardiology following. Blood pressure medications adjusted by cardiology.   2. History of atrial fibrillation. Currently rate controlled, diltiazem held due to possible bradycardia at home with hypotension, as needed IV Lopressor, poor candidate for anticoagulation due to high fall risk and risk of bleeding. Monitor on telemetry. Stable TSH. Cardiology requested to drop aspirin to 81 mg and add low-dose beta blocker on 06/24/2014.   3. Dysphagia. Speech following. Currently on dysphagia 3 diet.   4. Dementia. Currently on Aricept, at risk for delirium, continue Remeron and Seroquel.   5. BPH. Continue Flomax and monitor. Monitor blood pressures.   6. DM type II with diabetic neuropathy. Currently on sliding scale, continue Neurontin, monitor CBGs and even see.  Lab Results  Component Value Date   HGBA1C 6.2* 06/21/2014    CBG (last 3)   Recent Labs  06/23/14 1656 06/23/14 2101  06/24/14 0724  GLUCAP 142* 155* 105*    7. Acute on chronic diastolic CHF and recent EF 50% on echo 2014. Repeat Lasix, some element of mucous plugging as well will add chest PT and flutter valve, no fever no productive cough, monitor. Echo noted.      Code Status: DNR  Family Communication: Son and daughter in law  Disposition Plan: TBD    Procedures  Echogram, MRI brain, CT head  TTE   - Left ventricle: Overall poor image quality low parasternalwindows. The cavity size was normal. Wall thickness was normal. Systolic function was normal. The estimated ejection fraction wasin the range of 55% to 60%. - Mitral valve: Calcified annulus. Mildly thickened leaflets . - Left atrium: The atrium was moderately dilated. - Right atrium: The atrium was mildly dilated.   EEG  This is a normal EEG recording during wakefulness and during sleep. No evidence of an epileptic disorder was demonstrated.   Consults  Neuro, cards   Medications  Scheduled Meds: . amLODipine  10 mg Oral Daily  . aspirin  325 mg Oral Daily  . donepezil  10 mg Oral QHS  . furosemide  40 mg Intravenous Once  . gabapentin  100 mg Oral BID  . heparin  5,000 Units Subcutaneous 3 times per day  . insulin aspart  0-9 Units Subcutaneous TID WC  . mirtazapine  30 mg Oral QHS  . QUEtiapine  25 mg Oral Daily  . senna  1 tablet Oral BID  . sodium chloride  3 mL Intravenous Q12H  . tamsulosin  0.8 mg Oral Daily   Continuous Infusions:   PRN Meds:.acetaminophen **OR** [DISCONTINUED] acetaminophen, levalbuterol, [DISCONTINUED] ondansetron **OR** ondansetron (ZOFRAN) IV  DVT Prophylaxis     Lab Results  Component Value Date   PLT 210 06/22/2014    Antibiotics     Anti-infectives    None          Objective:   Filed Vitals:   06/23/14 1417 06/23/14 1751 06/23/14 2059 06/24/14 0446  BP: 115/57  121/50 146/85  Pulse: 80  71 73  Temp: 99.1 F (37.3 C)  97.8 F (36.6 C) 98.4 F (36.9 C)   TempSrc: Oral  Oral Oral  Resp: 19  18 18   Height:      Weight:    83.779 kg (184 lb 11.2 oz)  SpO2: 96% 96% 98% 95%    Wt Readings from Last 3 Encounters:  06/24/14 83.779 kg (184 lb 11.2 oz)  06/21/14 79.379 kg (175 lb)  03/10/14 82.555 kg (182 lb)     Intake/Output Summary (Last 24 hours) at 06/24/14 0849 Last data filed at 06/24/14 0456  Gross per 24 hour  Intake    483 ml  Output   2500 ml  Net  -2017 ml     Physical Exam  Awake Alert, Oriented X 3, No new F.N deficits, Normal affect [place].AT,PERRAL Supple Neck,No JVD, No cervical lymphadenopathy appriciated.  Symmetrical Chest wall movement, Good air movement bilaterally, CTAB RRR,No Gallops,Rubs or new Murmurs, No Parasternal Heave +ve B.Sounds, Abd Soft, No tenderness, No organomegaly appriciated, No rebound - guarding or rigidity. No Cyanosis, Clubbing or edema, No new Rash or bruise      Data Review   Micro Results No results found for this or any previous visit (from the past 240 hour(s)).  Radiology Reports Mr Brain Wo Contrast  06/22/2014   CLINICAL DATA:  Syncope and collapse.  EXAM: MRI HEAD WITHOUT CONTRAST  TECHNIQUE: Multiplanar, multiecho pulse sequences of the brain and surrounding structures were obtained without intravenous contrast.  COMPARISON:  Head CT 05/12/2010 and MRI 09/11/2009  FINDINGS: Dedicated thin section imaging through the temporal lobes demonstrates symmetric volume and signal of the hippocampi. There is no evidence of heterotopia.  There is no evidence of acute infarct, intracranial hemorrhage, mass, midline shift, or extra-axial fluid collection. There is moderate cerebral atrophy. Small, chronic infarcts are again seen in the basal ganglia and periatrial white matter bilaterally. Patchy T2 hyperintensities elsewhere in the cerebral white matter do not appear significantly changed and are nonspecific but compatible with mild to moderate chronic small vessel ischemic disease.  Prior  bilateral cataract extraction is noted. There is mild left frontal sinus and left ethmoid air cell mucosal thickening. Trace left mastoid fluid is noted. Major intracranial vascular flow voids are preserved.  IMPRESSION: 1. No evidence of acute intracranial abnormality or mass. 2. Chronic infarcts and chronic small vessel ischemic disease without significant interval change.   Electronically Signed   By: Logan Bores   On: 06/22/2014 15:38   Dg Chest Port 1 View  06/23/2014   CLINICAL DATA:  Difficulty breathing  EXAM: PORTABLE CHEST - 1 VIEW  COMPARISON:  June 21, 2014  FINDINGS: There is new patchy airspace consolidation in the left base. Lungs elsewhere clear. Heart is mildly enlarged with pulmonary vascularity within normal limits. Patient is status post coronary artery bypass grafting. No adenopathy.  IMPRESSION: Patchy airspace  consolidation left base.   Electronically Signed   By: Lowella Grip M.D.   On: 06/23/2014 08:20   Dg Chest Port 1 View  06/21/2014   CLINICAL DATA:  Syncope, lower back pain  EXAM: PORTABLE CHEST - 1 VIEW  COMPARISON:  07/22/2012  FINDINGS: Cardiomediastinal silhouette is stable. Again noted status post CABG. No segmental infiltrate or pulmonary edema. Minimal interstitial prominence bilaterally.  IMPRESSION: No segmental infiltrate or pulmonary edema.  Status post CABG.   Electronically Signed   By: Lahoma Crocker M.D.   On: 06/21/2014 20:10     CBC  Recent Labs Lab 06/21/14 1950 06/22/14 0500  WBC 6.7 6.6  HGB 13.1 11.9*  HCT 38.6* 35.8*  PLT 238 210  MCV 93.0 91.6  MCH 31.6 30.4  MCHC 33.9 33.2  RDW 13.8 13.9  LYMPHSABS 2.4  --   MONOABS 0.5  --   EOSABS 0.1  --   BASOSABS 0.0  --     Chemistries   Recent Labs Lab 06/21/14 1950 06/22/14 0500  NA 139 140  K 3.5 3.7  CL 107 109  CO2 21 27  GLUCOSE 144* 126*  BUN 20 19  CREATININE 1.24 1.29  CALCIUM 8.7 8.5    ------------------------------------------------------------------------------------------------------------------ estimated creatinine clearance is 42.6 mL/min (by C-G formula based on Cr of 1.29). ------------------------------------------------------------------------------------------------------------------  Recent Labs  06/21/14 1716  HGBA1C 6.2*   ------------------------------------------------------------------------------------------------------------------ No results for input(s): CHOL, HDL, LDLCALC, TRIG, CHOLHDL, LDLDIRECT in the last 72 hours. ------------------------------------------------------------------------------------------------------------------  Recent Labs  06/22/14 1054  TSH 0.873   ------------------------------------------------------------------------------------------------------------------ No results for input(s): VITAMINB12, FOLATE, FERRITIN, TIBC, IRON, RETICCTPCT in the last 72 hours.  Coagulation profile No results for input(s): INR, PROTIME in the last 168 hours.  No results for input(s): DDIMER in the last 72 hours.  Cardiac Enzymes No results for input(s): CKMB, TROPONINI, MYOGLOBIN in the last 168 hours.  Invalid input(s): CK ------------------------------------------------------------------------------------------------------------------ Invalid input(s): POCBNP     Time Spent in minutes 35   SINGH,PRASHANT K M.D on 06/24/2014 at 8:49 AM  Between 7am to 7pm - Pager - [phone]  After 7pm go to [email or web address] - [facility] Office  [phone]

## 2014-06-24 NOTE — Progress Notes (Signed)
Notified S. Alene Mires of continued hypotension s/p 567mL bolus. Patient continues to be asymptomatic. Orders to recheck BP manually before night time medications and if still low page for orders as beta blocker may need to be held.

## 2014-06-25 LAB — CLOSTRIDIUM DIFFICILE BY PCR: Toxigenic C. Difficile by PCR: NEGATIVE

## 2014-06-25 LAB — GLUCOSE, CAPILLARY
Glucose-Capillary: 99 mg/dL (ref 70–99)
Glucose-Capillary: 101 mg/dL — ABNORMAL HIGH (ref 70–99)

## 2014-06-25 MED ORDER — ASPIRIN 81 MG PO CHEW: 81.0000 mg | CHEWABLE_TABLET | Freq: Every day | ORAL | Status: AC

## 2014-06-25 MED ORDER — LOPERAMIDE HCL 2 MG PO TABS: 2.0000 mg | ORAL_TABLET | Freq: Four times a day (QID) | ORAL | Status: AC | PRN

## 2014-06-25 MED ORDER — PINDOLOL 5 MG PO TABS: 2.5000 mg | ORAL_TABLET | Freq: Two times a day (BID) | ORAL | Status: AC

## 2014-06-25 NOTE — Discharge Instructions (Signed)
Follow with Primary MD [name], MD in 7 days   Get CBC, CMP, 2 view Chest X ray checked  by Primary MD next visit.    Activity: As tolerated with Full fall precautions use walker/cane & assistance as needed   Disposition Home     Diet: Heart Healthy Low Carb, with feeding assistance and aspiration precautions as needed.  For Heart failure patients - Check your Weight same time everyday, if you gain over 2 pounds, or you develop in leg swelling, experience more shortness of breath or chest pain, call your Primary MD immediately. Follow Cardiac Low Salt Diet and 1.8 lit/day fluid restriction.   On your next visit with your primary care physician please Get Medicines reviewed and adjusted.   Please request your Prim.MD to go over all Hospital Tests and Procedure/Radiological results at the follow up, please get all Hospital records sent to your Prim MD by signing hospital release before you go home.   If you experience worsening of your admission symptoms, develop shortness of breath, life threatening emergency, suicidal or homicidal thoughts you must seek medical attention immediately by calling 911 or calling your MD immediately  if symptoms less severe.  You Must read complete instructions/literature along with all the possible adverse reactions/side effects for all the Medicines you take and that have been prescribed to you. Take any new Medicines after you have completely understood and accpet all the possible adverse reactions/side effects.   Do not drive, operating heavy machinery, perform activities at heights, swimming or participation in water activities or provide baby sitting services if your were admitted for syncope or siezures until you have seen by Primary MD or a Neurologist and advised to do so again.  Do not drive when taking Pain medications.    Do not take more than prescribed Pain, Sleep and Anxiety Medications  Special Instructions: If you have smoked or  chewed Tobacco  in the last 2 yrs please stop smoking, stop any regular Alcohol  and or any Recreational drug use.  Wear Seat belts while driving.   Please note  You were cared for by a hospitalist during your hospital stay. If you have any questions about your discharge medications or the care you received while you were in the hospital after you are discharged, you can call the unit and asked to speak with the hospitalist on call if the hospitalist that took care of you is not available. Once you are discharged, your primary care physician will handle any further medical issues. Please note that NO REFILLS for any discharge medications will be authorized once you are discharged, as it is imperative that you return to your primary care physician (or establish a relationship with a primary care physician if you do not have one) for your aftercare needs so that they can reassess your need for medications and monitor your lab values.

## 2014-06-25 NOTE — Discharge Summary (Signed)
Nathaniel Lopez, is a 80 y.o. male  DOB [birth year]  MRN [id number].  Admission date:  06/21/2014  Admitting Physician  Waldemar Dickens, MD  Discharge Date:  06/25/2014   Primary MD  [name], MD  Recommendations for primary care physician for things to follow:   Monitor heart rate and blood pressure closely.   Admission Diagnosis  Syncope [R55]   Discharge Diagnosis  Syncope [R55]     Principal Problem:   Syncope and collapse Active Problems:   Chronic atrial fibrillation   Chronic diastolic heart failure   COPE (chronic obstructive pulmonary emphysema)   CAD (coronary artery disease) s/p CABG 2006   DM type 2 with diabetic peripheral neuropathy   Bradycardia-reported (HR of 30 but no strips)   Alzheimer's dementia with behavioral disturbance   Dysphagia   CKD (chronic kidney disease) stage 3, GFR 30-59 ml/min   Arterial hypotension      Past Medical History  Diagnosis Date  . CHF (congestive heart failure)     diastolic dysfunction  . Atrial fibrillation     permanent  . Colon cancer   . Carotid arterial disease   . COPD (chronic obstructive pulmonary disease)     moderate 2006 by PFT FEV1 1.39  . Hyperlipidemia   . Type 2 diabetes mellitus   . Hypertension   . Hypothyroid   . Pancreatic cyst 12/14/2011    MRI - stable cyst lesion, likely benign  . Myocardial infarct   . Coronary artery disease   . Prostatitis   . Gallstones   . TIA (transient ischemic attack)   . Tremor   . Carotid bruit 12/17/2010    doppler - ICAs normal patency w/o evidence of significant diameter reduction/dissection; R ECA turbulent flowthroughout, possible source of bruit  . Cerebral atherosclerosis 11/10/2009    doppler - R bulb and proximal ICA 50-69% diameter reduction; R ECA high grade stenosis; L ICA 0-49% diameter  reduction  . Carotid bruit 10/21/2006    doppler - R bulb and proximal ICA 50-69% diameter reduction, R ECA high grade stenosis; L ICA 0-49% diameter reduction  . Dementia   . Incomplete bladder emptying   . Acute pyelonephritis without lesion of renal medullary necrosis   . Full incontinence of feces   . Other specified disease of pancreas   . Unspecified disorder of prostate   . Herpes zoster without mention of complication   . Pain in joint, pelvic region and thigh   . Other vitamin B12 deficiency anemia   . Vascular dementia, uncomplicated   . Muscle weakness (generalized)   . Other specified disorder of skin   . Depression   . Coronary atherosclerosis of unspecified type of vessel, native or graft     Past Surgical History  Procedure Laterality Date  . Hernia repair  2003  . Cholecystectomy  1990  . Coronary artery bypass graft  2006    LIMA to LAD; RSV to ICA; RSV to posterior descending CA  . Colon surgery  1989  . Cataract extraction Right 2006  . Tee with cardioversion  04/16/2010    EF 55-60%; LA mod dialted; AF precludes elval of LV diastolic fcn; septal motion consistent w/ post-thoracotomy state  . Cardiac catheterization  12/03/2004    total occulsion of LAD w/ retrograde filing, total occlusion RCA w/ l to r collaterals, 90% proximal optional diagonal disease; normal LV systolic fcn, no renal artery stenosis;   . Colonoscopy  01/27/2008    Dr.Magod, polyps  . Colonoscopy  03/02/2009    Dr.Magod, adenomatous and hyperplastic polyps no malignancy        History of present illness and  Hospital Course:     Kindly see H&P for history of present illness and admission details, please review complete Labs, Consult reports and Test reports for all details in brief  HPI  from the history and physical done on the day of admission   Per family. Pt was eating dinner when he slumped over and became unresponsive. Pt did not fall and there was no head trauma. PT slowly  came around w/ stimulation by family. It took 15 min for pt to return to baseline. PTs family report that pt was rigid at this time w/ a fixed gaze. Always w/ pulse. Family reported that this happened 4 times during this 15 min period. Episodes would las a couple minutes and would then be groggy and out of it after each episode. EMS called and pt was noted to have a HR in th3 40s and SBP of 60. Pt given 1L NS by EMS w/ improvement in VS. Initial troponin and EKG nml   Hospital Course    1. Syncope and Collapse -  Due to bradycardia and hypotension per EMS, offending medications held, blood pressure stable, symptom-free, no focal deficits. CT head unremarkable, stable TSH, EEG MRI brain stable, seen by neuro. echogram stable, Cardiology and Cardizem and ACE inhibitor stopped by them. Low-dose beta blockers added. No blood pressure heart rate stable, not orthostatic and symptom-free. Discussed with Dr. Wynonia Lawman cardiologist discharge on present regimen with outpatient follow-up with his primary cardiologist. Daughter-in-law and son updated.   2. History of atrial fibrillation. Currently rate controlled,  Stable TSH. Cardiology requested to drop aspirin to 81 mg and add low-dose beta blocker on 06/24/2014.    3. Dysphagia. Now completely resolved on heart healthy diet. Seen by speech.   4. Dementia. Currently on Aricept, at risk for delirium, continue Remeron and Seroquel.   5. BPH. Continue Flomax and monitor. Monitor blood pressures.   6. DM type II with diabetic neuropathy. Currently metformin, A1c 6.2, continue Neurontin.   7. Acute on chronic diastolic CHF and recent EF 50% on echo 2014. Needed 1 dose of IV Lasix. Now completely stable and symptom-free, no oxygen need no rales, resume low-dose oral Lasix upon discharge at home.     Discharge Condition: Stable   Follow UP  Follow-up Information    Follow up with Sanda Klein, MD.   Specialty:  Cardiology   Why:  office will  call you   Contact information:   [address] [place] [address] [phone]       Follow up with [name], MD. Schedule an appointment as soon as possible for a visit in 1 week.   Specialty:  Internal Medicine   Why:  And your cardiologist   Contact information:   [address] [place] [address] [phone]  Discharge Instructions  and  Discharge Medications      Discharge Instructions    Discharge instructions    Complete by:  As directed   Follow with Primary MD [name], MD in 7 days   Get CBC, CMP, 2 view Chest X ray checked  by Primary MD next visit.    Activity: As tolerated with Full fall precautions use walker/cane & assistance as needed   Disposition Home     Diet: Heart Healthy Low Carb, with feeding assistance and aspiration precautions as needed.  For Heart failure patients - Check your Weight same time everyday, if you gain over 2 pounds, or you develop in leg swelling, experience more shortness of breath or chest pain, call your Primary MD immediately. Follow Cardiac Low Salt Diet and 1.8 lit/day fluid restriction.   On your next visit with your primary care physician please Get Medicines reviewed and adjusted.   Please request your Prim.MD to go over all Hospital Tests and Procedure/Radiological results at the follow up, please get all Hospital records sent to your Prim MD by signing hospital release before you go home.   If you experience worsening of your admission symptoms, develop shortness of breath, life threatening emergency, suicidal or homicidal thoughts you must seek medical attention immediately by calling 911 or calling your MD immediately  if symptoms less severe.  You Must read complete instructions/literature along with all the possible adverse reactions/side effects for all the Medicines you take and that have been prescribed to you. Take any new Medicines after you have completely  understood and accpet all the possible adverse reactions/side effects.   Do not drive, operating heavy machinery, perform activities at heights, swimming or participation in water activities or provide baby sitting services if your were admitted for syncope or siezures until you have seen by Primary MD or a Neurologist and advised to do so again.  Do not drive when taking Pain medications.    Do not take more than prescribed Pain, Sleep and Anxiety Medications  Special Instructions: If you have smoked or chewed Tobacco  in the last 2 yrs please stop smoking, stop any regular Alcohol  and or any Recreational drug use.  Wear Seat belts while driving.   Please note  You were cared for by a hospitalist during your hospital stay. If you have any questions about your discharge medications or the care you received while you were in the hospital after you are discharged, you can call the unit and asked to speak with the hospitalist on call if the hospitalist that took care of you is not available. Once you are discharged, your primary care physician will handle any further medical issues. Please note that NO REFILLS for any discharge medications will be authorized once you are discharged, as it is imperative that you return to your primary care physician (or establish a relationship with a primary care physician if you do not have one) for your aftercare needs so that they can reassess your need for medications and monitor your lab values.     Increase activity slowly    Complete by:  As directed             Medication List    STOP taking these medications        aspirin 325 MG tablet  Replaced by:  aspirin 81 MG chewable tablet     diltiazem 240 MG 24 hr capsule  Commonly known as:  CARDIZEM CD  losartan 50 MG tablet  Commonly known as:  COZAAR     niacin 1000 MG CR tablet  Commonly known as:  NIASPAN      TAKE these medications        acetaminophen 325 MG tablet  Commonly  known as:  TYLENOL  Take 2 tablets (650 mg total) by mouth every 6 (six) hours as needed for mild pain (or Fever >/= 101).     aspirin 81 MG chewable tablet  Chew 1 tablet (81 mg total) by mouth daily.     donepezil 10 MG tablet  Commonly known as:  ARICEPT  Take 1 tablet (10 mg total) by mouth at bedtime.     FLOMAX 0.4 MG Caps capsule  Generic drug:  tamsulosin  Take 0.8 mg by mouth daily. 2 by mouth once daily     furosemide 20 MG tablet  Commonly known as:  LASIX  Take 1 tablet (20 mg total) by mouth daily as needed.     gabapentin 100 MG capsule  Commonly known as:  NEURONTIN  Take 100 mg by mouth 2 (two) times daily.     glucose blood test strip  One Touch Ultra Blue Test Strip. Use strip to check glucose twice daily. Dx. E11.9     levothyroxine 75 MCG tablet  Commonly known as:  SYNTHROID, LEVOTHROID  Take 75 mcg by mouth daily before breakfast.     levothyroxine 75 MCG tablet  Commonly known as:  SYNTHROID, LEVOTHROID  TAKE ONE TABLET BY MOUTH ONCE DAILY BEFORE BREAKFAST     loperamide 2 MG tablet  Commonly known as:  IMODIUM A-D  Take 1 tablet (2 mg total) by mouth 4 (four) times daily as needed for diarrhea or loose stools.     Melatonin 5 MG Tabs  Take 5 mg by mouth at bedtime.     metFORMIN 500 MG tablet  Commonly known as:  GLUCOPHAGE  Take 500 mg by mouth daily with breakfast.     metFORMIN 500 MG tablet  Commonly known as:  GLUCOPHAGE  TAKE ONE TABLET DAILY FOR DIABETES     mirtazapine 30 MG tablet  Commonly known as:  REMERON  Take 1 tablet (30 mg total) by mouth at bedtime.     pindolol 5 MG tablet  Commonly known as:  VISKEN  Take 0.5 tablets (2.5 mg total) by mouth 2 (two) times daily.     pravastatin 40 MG tablet  Commonly known as:  PRAVACHOL  Take 1 tablet (40 mg total) by mouth daily.     QUEtiapine 25 MG tablet  Commonly known as:  SEROQUEL  Take 1 tablet (25 mg total) by mouth daily.     vitamin B-12 1000 MCG tablet  Commonly  known as:  CYANOCOBALAMIN  Take 1,000 mcg by mouth daily.          Diet and Activity recommendation: See Discharge Instructions above   Consults obtained - Cards   Major procedures and Radiology Reports - PLEASE review detailed and final reports for all details, in brief -     TTE   - Left ventricle: Overall poor image quality low parasternalwindows. The cavity size was normal. Wall thickness was normal. Systolic function was normal. The estimated ejection fraction wasin the range of 55% to 60%. - Mitral valve: Calcified annulus. Mildly thickened leaflets . - Left atrium: The atrium was moderately dilated. - Right atrium: The atrium was mildly dilated.   EEG  This is a normal  EEG recording during wakefulness and during sleep. No evidence of an epileptic disorder was demonstrated.   Mr Brain Wo Contrast  06/22/2014   CLINICAL DATA:  Syncope and collapse.  EXAM: MRI HEAD WITHOUT CONTRAST  TECHNIQUE: Multiplanar, multiecho pulse sequences of the brain and surrounding structures were obtained without intravenous contrast.  COMPARISON:  Head CT 05/12/2010 and MRI 09/11/2009  FINDINGS: Dedicated thin section imaging through the temporal lobes demonstrates symmetric volume and signal of the hippocampi. There is no evidence of heterotopia.  There is no evidence of acute infarct, intracranial hemorrhage, mass, midline shift, or extra-axial fluid collection. There is moderate cerebral atrophy. Small, chronic infarcts are again seen in the basal ganglia and periatrial white matter bilaterally. Patchy T2 hyperintensities elsewhere in the cerebral white matter do not appear significantly changed and are nonspecific but compatible with mild to moderate chronic small vessel ischemic disease.  Prior bilateral cataract extraction is noted. There is mild left frontal sinus and left ethmoid air cell mucosal thickening. Trace left mastoid fluid is noted. Major intracranial vascular flow voids are  preserved.  IMPRESSION: 1. No evidence of acute intracranial abnormality or mass. 2. Chronic infarcts and chronic small vessel ischemic disease without significant interval change.   Electronically Signed   By: Logan Bores   On: 06/22/2014 15:38   Dg Chest Port 1 View  06/23/2014   CLINICAL DATA:  Difficulty breathing  EXAM: PORTABLE CHEST - 1 VIEW  COMPARISON:  June 21, 2014  FINDINGS: There is new patchy airspace consolidation in the left base. Lungs elsewhere clear. Heart is mildly enlarged with pulmonary vascularity within normal limits. Patient is status post coronary artery bypass grafting. No adenopathy.  IMPRESSION: Patchy airspace consolidation left base.   Electronically Signed   By: Lowella Grip M.D.   On: 06/23/2014 08:20   Dg Chest Port 1 View  06/21/2014   CLINICAL DATA:  Syncope, lower back pain  EXAM: PORTABLE CHEST - 1 VIEW  COMPARISON:  07/22/2012  FINDINGS: Cardiomediastinal silhouette is stable. Again noted status post CABG. No segmental infiltrate or pulmonary edema. Minimal interstitial prominence bilaterally.  IMPRESSION: No segmental infiltrate or pulmonary edema.  Status post CABG.   Electronically Signed   By: Lahoma Crocker M.D.   On: 06/21/2014 20:10    Micro Results      Recent Results (from the past 240 hour(s))  Clostridium Difficile by PCR     Status: None   Collection Time: 06/24/14  4:09 PM  Result Value Ref Range Status   C difficile by pcr NEGATIVE NEGATIVE Final       Today   Subjective:   Nathaniel Lopez today has no headache,no chest abdominal pain,no new weakness tingling or numbness, feels much better wants to go home today.    Objective:   Blood pressure 115/68, pulse 89, temperature 97.5 F (36.4 C), temperature source Oral, resp. rate 18, height 5\' 9"  (1.753 m), weight 80.015 kg (176 lb 6.4 oz), SpO2 98 %.   Intake/Output Summary (Last 24 hours) at 06/25/14 1116 Last data filed at 06/25/14 0900  Gross per 24 hour  Intake    240 ml   Output   1275 ml  Net  -1035 ml    Exam Awake Alert, Oriented x 3, No new F.N deficits, Normal affect [place].AT,PERRAL Supple Neck,No JVD, No cervical lymphadenopathy appriciated.  Symmetrical Chest wall movement, Good air movement bilaterally, CTAB RRR,No Gallops,Rubs or new Murmurs, No Parasternal Heave +ve B.Sounds, Abd Soft, Non tender, No  organomegaly appriciated, No rebound -guarding or rigidity. No Cyanosis, Clubbing or edema, No new Rash or bruise  Data Review   CBC w Diff: Lab Results  Component Value Date   WBC 6.6 06/22/2014   WBC 6.5 01/25/2014   HGB 11.9* 06/22/2014   HCT 35.8* 06/22/2014   PLT 210 06/22/2014   LYMPHOPCT 35 06/21/2014   MONOPCT 7 06/21/2014   EOSPCT 1 06/21/2014   BASOPCT 0 06/21/2014    CMP: Lab Results  Component Value Date   NA 140 06/22/2014   NA 138 01/25/2014   K 3.7 06/22/2014   CL 109 06/22/2014   CO2 27 06/22/2014   BUN 19 06/22/2014   BUN 28* 01/25/2014   CREATININE 1.29 06/22/2014   CREATININE 1.08 02/26/2013   PROT 6.2 01/25/2014   PROT 6.8 07/22/2012   ALBUMIN 3.3* 07/22/2012   BILITOT <0.2 01/25/2014   ALKPHOS 121* 01/25/2014   AST 11 01/25/2014   ALT 9 01/25/2014  .   Total Time in preparing paper work, data evaluation and todays exam - 35 minutes  Thurnell Lose M.D on 06/25/2014 at 11:16 AM  [facility] Office  [phone]

## 2014-06-25 NOTE — Progress Notes (Signed)
Manually taken BP has risen to 110/70, S. Alene Mires notified, will continue to monitor closely.

## 2014-06-25 NOTE — Progress Notes (Signed)
Pt discharged to home per MD order. Pt and daughter received and reviewed all discharge instructions and medication information including follow-up appointments and prescription information. Pt daughter verbalized understanding. Pt alert discharge, oriented to baseline, with no complaints of pain. Pt IV and telemetry box removed prior to discharge. Pt escorted to private vehicle via wheelchair by nurse tech. Lenna Sciara

## 2014-06-25 NOTE — Progress Notes (Addendum)
Subjective:  No complaints today.  No recurrent dizziness.  Blood pressure was a little low overnight  Objective:  Vital Signs in the last 24 hours: BP 121/67 mmHg  Pulse 89  Temp(Src) 97.5 F (36.4 C) (Oral)  Resp 18  Ht 5\' 9"  (1.753 m)  Wt 80.015 kg (176 lb 6.4 oz)  BMI 26.04 kg/m2  SpO2 98%  Physical Exam: Pleasant white male in no acute distress, oriented to place Lungs:  Clear Cardiac:  Irregular rhythm, normal S1 and S2, no S3 Extremities:  No edema present  Intake/Output from previous day: 12/25 0701 - 12/26 0700 In: 363 [P.O.:360; I.V.:3] Out: 3200 [Urine:3200]  Weight Filed Weights   06/23/14 0447 06/24/14 0446 06/25/14 0517  Weight: 84.868 kg (187 lb 1.6 oz) 83.779 kg (184 lb 11.2 oz) 80.015 kg (176 lb 6.4 oz)    Telemetry: Atrial fibrillation, no bradycardia noted overnight  Assessment/Plan:  1.  Syncope. 2.  Atrial fibrillation-rate is somewhat faster now. 3.  Coronary artery disease with previous bypass grafting. 4.  Chronic diastolic heart failure.  Recommendations:  Discuss possible implantation of a loop monitor with the patient's family today.      Kerry Hough  MD [facility]  06/25/2014, 8:01 AM   Spoke with the patient's son, Glendell Docker, who is power of attorney, as well as daughter, Neoma Laming, who has worked at [employer] in the past.  They expressed a strong feeling that he would not want to have a loop monitor and they do not want a pacemaker implanted under any circumstances.  She mentioned that if the episode happened at home that EMS would not have been called.  He is clearly a DO NOT RESUSCITATE according to them.  In light of this, no further Cardiologic workup is necessary.  Provided family with DO NOT RESUSCITATE form to have at home.  Kerry Hough MD [facility]

## 2014-07-04 ENCOUNTER — Other Ambulatory Visit: Payer: Self-pay | Admitting: *Deleted

## 2014-07-04 MED ORDER — MIRTAZAPINE 30 MG PO TABS: 30.0000 mg | ORAL_TABLET | Freq: Every day | ORAL | Status: AC

## 2014-07-04 MED ORDER — LEVOTHYROXINE SODIUM 75 MCG PO TABS: ORAL_TABLET | ORAL | Status: AC

## 2014-07-04 NOTE — Telephone Encounter (Signed)
Nathaniel Lopez Drug

## 2014-07-18 ENCOUNTER — Other Ambulatory Visit: Payer: Self-pay | Admitting: *Deleted

## 2014-07-18 MED ORDER — METFORMIN HCL 500 MG PO TABS: ORAL_TABLET | ORAL | Status: AC

## 2014-07-18 NOTE — Telephone Encounter (Signed)
Nathaniel Lopez 

## 2014-08-02 ENCOUNTER — Telehealth: Payer: Self-pay | Admitting: *Deleted

## 2014-08-02 NOTE — Telephone Encounter (Signed)
Noted  

## 2014-08-02 NOTE — Telephone Encounter (Signed)
Daughter, Hilda Blades called and stated that patient just got out of Hospital. She wanted [place] to know that she will not be bringing him into the office because patient does not feel well enough and does not want to go out. She stated that she is giving him Pallitive care but is not under Hospice care. She is just watching and caring for him since she is a RMA. She will no longer be bringing him in to be seen since he has a DNR and know his heart is failing. They have just decided to just wait it out. She stated we do not need to return the call.

## 2014-08-23 ENCOUNTER — Other Ambulatory Visit: Payer: Self-pay | Admitting: Internal Medicine

## 2014-08-23 ENCOUNTER — Encounter: Payer: Self-pay | Admitting: Internal Medicine

## 2014-12-16 ENCOUNTER — Other Ambulatory Visit: Payer: Self-pay

## 2014-12-20 ENCOUNTER — Ambulatory Visit: Payer: Self-pay | Admitting: Internal Medicine

## 2014-12-22 ENCOUNTER — Ambulatory Visit: Payer: Self-pay | Admitting: Nurse Practitioner

## 2015-01-05 ENCOUNTER — Telehealth: Payer: Self-pay | Admitting: *Deleted

## 2015-01-05 NOTE — Telephone Encounter (Signed)
Cathy from Hospice called she stated the daughter of Nathaniel Lopez called and believes that her father had a stroke yesterday and has declined overnight. She also stated that she is unable to bring him to the office to be seen, hospice is requesting a order for hospice assessment and if the hospice doctors could help with symptom management. Sherrie Mustache  Is the PCP for this patient and she has agreed to all of these requests.

## 2015-01-11 ENCOUNTER — Telehealth: Payer: Self-pay | Admitting: *Deleted

## 2015-01-11 NOTE — Telephone Encounter (Signed)
Zenaida Deed, Nurse with Hospice called for clarification on patient's medication Seroquel. They had on file that patient was to take it as needed. Clarified in patient's chart and we have he is suppose to take one tablet by mouth once daily. Nurse Notified and agreed and will make the corrections on their record.

## 2015-03-02 DEATH — deceased
